# Patient Record
Sex: Male | Born: 1960
Health system: Southern US, Community
[De-identification: ages and names within clinical notes are randomized; demographics above are authoritative.]

## PROBLEM LIST (undated history)

## (undated) DIAGNOSIS — E785 Hyperlipidemia, unspecified: Secondary | ICD-10-CM

## (undated) DIAGNOSIS — T7840XA Allergy, unspecified, initial encounter: Secondary | ICD-10-CM

## (undated) HISTORY — DX: Allergy, unspecified, initial encounter: T78.40XA

## (undated) HISTORY — DX: Hyperlipidemia, unspecified: E78.5

---

## 1999-03-10 ENCOUNTER — Emergency Department (HOSPITAL_COMMUNITY): Admission: EM | Admit: 1999-03-10 | Discharge: 1999-03-10 | Payer: Self-pay | Admitting: Emergency Medicine

## 1999-03-10 ENCOUNTER — Encounter: Payer: Self-pay | Admitting: Emergency Medicine

## 2004-05-09 ENCOUNTER — Emergency Department (HOSPITAL_COMMUNITY): Admission: AC | Admit: 2004-05-09 | Discharge: 2004-05-09 | Payer: Self-pay
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
## 2019-04-10 DIAGNOSIS — F4323 Adjustment disorder with mixed anxiety and depressed mood: Secondary | ICD-10-CM | POA: Diagnosis not present

## 2019-04-17 DIAGNOSIS — F4323 Adjustment disorder with mixed anxiety and depressed mood: Secondary | ICD-10-CM | POA: Diagnosis not present

## 2019-05-30 DIAGNOSIS — H40033 Anatomical narrow angle, bilateral: Secondary | ICD-10-CM | POA: Diagnosis not present

## 2019-05-30 DIAGNOSIS — H2513 Age-related nuclear cataract, bilateral: Secondary | ICD-10-CM | POA: Diagnosis not present

## 2019-09-19 ENCOUNTER — Other Ambulatory Visit: Payer: Self-pay

## 2019-09-19 ENCOUNTER — Encounter: Payer: Self-pay | Admitting: Family Medicine

## 2019-09-19 ENCOUNTER — Ambulatory Visit: Payer: BC Managed Care – PPO | Admitting: Family Medicine

## 2019-09-19 VITALS — BP 139/83 | HR 65 | Temp 99.6°F | Resp 18 | Ht 70.5 in | Wt 177.8 lb

## 2019-09-19 DIAGNOSIS — Z1212 Encounter for screening for malignant neoplasm of rectum: Secondary | ICD-10-CM

## 2019-09-19 DIAGNOSIS — Z1211 Encounter for screening for malignant neoplasm of colon: Secondary | ICD-10-CM

## 2019-09-19 DIAGNOSIS — Z13220 Encounter for screening for lipoid disorders: Secondary | ICD-10-CM | POA: Diagnosis not present

## 2019-09-19 DIAGNOSIS — Z125 Encounter for screening for malignant neoplasm of prostate: Secondary | ICD-10-CM

## 2019-09-19 DIAGNOSIS — Z1329 Encounter for screening for other suspected endocrine disorder: Secondary | ICD-10-CM

## 2019-09-19 DIAGNOSIS — Z6825 Body mass index (BMI) 25.0-25.9, adult: Secondary | ICD-10-CM | POA: Diagnosis not present

## 2019-09-19 DIAGNOSIS — Z0000 Encounter for general adult medical examination without abnormal findings: Secondary | ICD-10-CM

## 2019-09-19 DIAGNOSIS — Z720 Tobacco use: Secondary | ICD-10-CM | POA: Insufficient documentation

## 2019-09-19 DIAGNOSIS — Z130 Encounter for screening for diseases of the blood and blood-forming organs and certain disorders involving the immune mechanism: Secondary | ICD-10-CM

## 2019-09-19 DIAGNOSIS — T7840XA Allergy, unspecified, initial encounter: Secondary | ICD-10-CM | POA: Insufficient documentation

## 2019-09-19 NOTE — Progress Notes (Signed)
Subjective:  Patient ID: Rodney Sims, male    DOB: 09-Feb-1961, 58 y.o.   MRN: [id number]  Patient Care Team: Baruch Gouty, FNP as PCP - General (Family Medicine)   Chief Complaint:  Establish Care   HPI: Rodney Sims is a 59 y.o. male presenting on 09/19/2019 for Establish Care   Pt presents today to establish care. States he is pretty healthy overall. No specific complaints or concerns today. States he does not see a PCP on a regular basis. States he does have chronic rhinitis and takes antihistamines on a regular basis that controls symptoms well. He states he does smoke 1 PPD and has been smoking for 27 years. States he drinks at least 4 cans of beer per night and sometimes more. He denies counseling for cessation today. He does not watch his diet or exercise on a regular basis.     Relevant past medical, surgical, family, and social history reviewed and updated as indicated.  Allergies and medications reviewed and updated. Date reviewed: Chart in Epic.   Past Medical History:  Diagnosis Date  . Allergy     History reviewed. No pertinent surgical history.  Social History   Socioeconomic History  . Marital status: Married    Spouse name: Not on file  . Number of children: 1  . Years of education: Not on file  . Highest education level: Not on file  Occupational History  . Occupation: maintanence  Tobacco Use  . Smoking status: Current Every Day Smoker    Packs/day: 1.50    Years: 27.00    Pack years: 40.50    Types: Cigarettes  . Smokeless tobacco: Never Used  Substance and Sexual Activity  . Alcohol use: Not on file  . Drug use: Never  . Sexual activity: Not on file  Other Topics Concern  . Not on file  Social History Narrative  . Not on file   Social Determinants of Health   Financial Resource Strain:   . Difficulty of Paying Living Expenses: Not on file  Food Insecurity:   . Worried About Charity fundraiser in the Last Year: Not on file  .  Ran Out of Food in the Last Year: Not on file  Transportation Needs:   . Lack of Transportation (Medical): Not on file  . Lack of Transportation (Non-Medical): Not on file  Physical Activity:   . Days of Exercise per Week: Not on file  . Minutes of Exercise per Session: Not on file  Stress:   . Feeling of Stress : Not on file  Social Connections:   . Frequency of Communication with Friends and Family: Not on file  . Frequency of Social Gatherings with Friends and Family: Not on file  . Attends Religious Services: Not on file  . Active Member of Clubs or Organizations: Not on file  . Attends Archivist Meetings: Not on file  . Marital Status: Not on file  Intimate Partner Violence:   . Fear of Current or Ex-Partner: Not on file  . Emotionally Abused: Not on file  . Physically Abused: Not on file  . Sexually Abused: Not on file    Outpatient Encounter Medications as of 09/19/2019  Medication Sig  . Fexofenadine HCl (ALLEGRA PO) Take by mouth.   No facility-administered encounter medications on file as of 09/19/2019.    Allergies  Allergen Reactions  . Penicillins     Caused seizures as child  . Bee  Venom Hives  . Betamethasone   . Codeine   . Cyclobenzaprine     Review of Systems  Constitutional: Negative for activity change, appetite change, chills, diaphoresis, fatigue, fever and unexpected weight change.  HENT: Positive for postnasal drip and rhinorrhea. Negative for congestion.   Eyes: Negative.  Negative for photophobia and visual disturbance.  Respiratory: Negative for cough, chest tightness and shortness of breath.   Cardiovascular: Negative for chest pain, palpitations and leg swelling.  Gastrointestinal: Negative for abdominal pain, blood in stool, constipation, diarrhea, nausea and vomiting.  Endocrine: Negative.  Negative for cold intolerance, heat intolerance, polydipsia, polyphagia and polyuria.  Genitourinary: Negative for decreased urine volume,  difficulty urinating, dysuria, frequency and urgency.  Musculoskeletal: Negative for arthralgias, back pain, gait problem, joint swelling, myalgias, neck pain and neck stiffness.  Skin: Negative.   Allergic/Immunologic: Negative.   Neurological: Negative for dizziness, tremors, seizures, syncope, facial asymmetry, speech difficulty, weakness, light-headedness, numbness and headaches.  Hematological: Negative.   Psychiatric/Behavioral: Negative for confusion, hallucinations, sleep disturbance and suicidal ideas.  All other systems reviewed and are negative.       Objective:  BP 139/83   Pulse 65   Temp 99.6 F (37.6 C)   Resp 18   Ht 5' 10.5" (1.791 m)   Wt 177 lb 12.8 oz (80.6 kg)   SpO2 97%   BMI 25.15 kg/m    Wt Readings from Last 3 Encounters:  09/19/19 177 lb 12.8 oz (80.6 kg)    Physical Exam Vitals and nursing note reviewed.  Constitutional:      General: He is not in acute distress.    Appearance: Normal appearance. He is well-developed, well-groomed and overweight. He is not ill-appearing, toxic-appearing or diaphoretic.  HENT:     Head: Normocephalic and atraumatic.     Jaw: There is normal jaw occlusion.     Right Ear: Hearing, tympanic membrane, ear canal and external ear normal.     Left Ear: Hearing, tympanic membrane, ear canal and external ear normal.     Nose: Nose normal.     Mouth/Throat:     Lips: Pink.     Mouth: Mucous membranes are moist.     Pharynx: Oropharynx is clear. Uvula midline.  Eyes:     General: Lids are normal.     Extraocular Movements: Extraocular movements intact.     Conjunctiva/sclera: Conjunctivae normal.     Pupils: Pupils are equal, round, and reactive to light.  Neck:     Thyroid: No thyroid mass, thyromegaly or thyroid tenderness.     Vascular: No carotid bruit or JVD.     Trachea: Trachea and phonation normal.  Cardiovascular:     Rate and Rhythm: Normal rate and regular rhythm.     Chest Wall: PMI is not displaced.      Pulses: Normal pulses.     Heart sounds: Normal heart sounds. No murmur. No friction rub. No gallop.   Pulmonary:     Effort: Pulmonary effort is normal. No respiratory distress.     Breath sounds: Normal breath sounds. No wheezing.  Abdominal:     General: Bowel sounds are normal. There is no distension or abdominal bruit.     Palpations: Abdomen is soft. There is no hepatomegaly or splenomegaly.     Tenderness: There is no abdominal tenderness. There is no right CVA tenderness or left CVA tenderness.     Hernia: No hernia is present.  Musculoskeletal:  General: Normal range of motion.     Cervical back: Normal range of motion and neck supple.     Right lower leg: No edema.     Left lower leg: No edema.  Lymphadenopathy:     Cervical: No cervical adenopathy.  Skin:    General: Skin is warm and dry.     Capillary Refill: Capillary refill takes less than 2 seconds.     Coloration: Skin is not cyanotic, jaundiced or pale.     Findings: No rash.  Neurological:     General: No focal deficit present.     Mental Status: He is alert and oriented to person, place, and time.     Cranial Nerves: Cranial nerves are intact. No cranial nerve deficit.     Sensory: Sensation is intact. No sensory deficit.     Motor: Motor function is intact. No weakness.     Coordination: Coordination is intact. Coordination normal.     Gait: Gait is intact. Gait normal.     Deep Tendon Reflexes: Reflexes are normal and symmetric. Reflexes normal.  Psychiatric:        Attention and Perception: Attention and perception normal.        Mood and Affect: Mood and affect normal.        Speech: Speech normal.        Behavior: Behavior normal. Behavior is cooperative.        Thought Content: Thought content normal.        Cognition and Memory: Cognition and memory normal.        Judgment: Judgment normal.     No results found for this or any previous visit.     Pertinent labs & imaging results that were  available during my care of the patient were reviewed by me and considered in my medical decision making.  Assessment & Plan:  Lambros was seen today for establish care.  Diagnoses and all orders for this visit:  Annual physical exam Health maintenance discussed. Diet and exercise encouraged. Labs pending. Cologuard ordered.  -     CBC with Differential/Platelet -     CMP14+EGFR -     Lipid panel -     Thyroid Panel With TSH -     HIV Antibody (routine testing w rflx) -     Hepatitis C antibody -     PSA, total and free -     Cologuard  BMI 25.0-25.9,adult Diet and exercise encouraged. Labs pending.  -     CBC with Differential/Platelet -     CMP14+EGFR -     Lipid panel -     Thyroid Panel With TSH  Screening for colorectal cancer -     Cologuard  Screening for prostate cancer -     PSA, total and free  Screening for lipid disorders -     Lipid panel  Screening for deficiency anemia -     CBC with Differential/Platelet  Screening for endocrine disorder -     CMP14+EGFR -     Thyroid Panel With TSH  Pt declines smoking cessation counseling today.    Continue all other maintenance medications.  Follow up plan: Return in about 1 year (around 09/18/2020), or if symptoms worsen or fail to improve.  Continue healthy lifestyle choices, including diet (rich in fruits, vegetables, and lean proteins, and low in salt and simple carbohydrates) and exercise (at least 30 minutes of moderate physical activity daily).  Educational handout given for health  maintenance  The above assessment and management plan was discussed with the patient. The patient verbalized understanding of and has agreed to the management plan. Patient is aware to call the clinic if they develop any new symptoms or if symptoms persist or worsen. Patient is aware when to return to the clinic for a follow-up visit. Patient educated on when it is appropriate to go to the emergency department.   Monia Pouch,  FNP-C [facility] [phone]

## 2019-09-19 NOTE — Patient Instructions (Signed)

## 2019-09-20 LAB — THYROID PANEL WITH TSH
Free Thyroxine Index: 1.8 (ref 1.2–4.9)
T3 Uptake Ratio: 35 % (ref 24–39)
T4, Total: 5.1 ug/dL (ref 4.5–12.0)
TSH: 1.44 u[IU]/mL (ref 0.450–4.500)

## 2019-09-20 LAB — CBC WITH DIFFERENTIAL/PLATELET
Basophils Absolute: 0.1 10*3/uL (ref 0.0–0.2)
Basos: 2 %
EOS (ABSOLUTE): 0.2 10*3/uL (ref 0.0–0.4)
Eos: 3 %
Hematocrit: 43.4 % (ref 37.5–51.0)
Hemoglobin: 14.8 g/dL (ref 13.0–17.7)
Immature Grans (Abs): 0 10*3/uL (ref 0.0–0.1)
Immature Granulocytes: 0 %
Lymphocytes Absolute: 2 10*3/uL (ref 0.7–3.1)
Lymphs: 30 %
MCH: 33.3 pg — ABNORMAL HIGH (ref 26.6–33.0)
MCHC: 34.1 g/dL (ref 31.5–35.7)
MCV: 98 fL — ABNORMAL HIGH (ref 79–97)
Monocytes Absolute: 0.7 10*3/uL (ref 0.1–0.9)
Monocytes: 11 %
Neutrophils Absolute: 3.6 10*3/uL (ref 1.4–7.0)
Neutrophils: 54 %
Platelets: 248 10*3/uL (ref 150–450)
RBC: 4.44 x10E6/uL (ref 4.14–5.80)
RDW: 12.4 % (ref 11.6–15.4)
WBC: 6.6 10*3/uL (ref 3.4–10.8)

## 2019-09-20 LAB — PSA, TOTAL AND FREE
PSA, Free Pct: 18.3 %
PSA, Free: 0.22 ng/mL
Prostate Specific Ag, Serum: 1.2 ng/mL (ref 0.0–4.0)

## 2019-09-20 LAB — CMP14+EGFR
ALT: 27 IU/L (ref 0–44)
AST: 30 IU/L (ref 0–40)
Albumin/Globulin Ratio: 2 (ref 1.2–2.2)
Albumin: 4.9 g/dL (ref 3.8–4.9)
Alkaline Phosphatase: 62 IU/L (ref 39–117)
BUN/Creatinine Ratio: 13 (ref 9–20)
BUN: 11 mg/dL (ref 6–24)
Bilirubin Total: 0.6 mg/dL (ref 0.0–1.2)
CO2: 23 mmol/L (ref 20–29)
Calcium: 9.9 mg/dL (ref 8.7–10.2)
Chloride: 102 mmol/L (ref 96–106)
Creatinine, Ser: 0.87 mg/dL (ref 0.76–1.27)
GFR calc Af Amer: 111 mL/min/{1.73_m2} (ref >59)
GFR calc non Af Amer: 96 mL/min/{1.73_m2} (ref >59)
Globulin, Total: 2.4 g/dL (ref 1.5–4.5)
Glucose: 85 mg/dL (ref 65–99)
Potassium: 4.7 mmol/L (ref 3.5–5.2)
Sodium: 142 mmol/L (ref 134–144)
Total Protein: 7.3 g/dL (ref 6.0–8.5)

## 2019-09-20 LAB — LIPID PANEL
Chol/HDL Ratio: 2 ratio (ref 0.0–5.0)
Cholesterol, Total: 210 mg/dL — ABNORMAL HIGH (ref 100–199)
HDL: 103 mg/dL (ref >39)
LDL Chol Calc (NIH): 97 mg/dL (ref 0–99)
Triglycerides: 58 mg/dL (ref 0–149)
VLDL Cholesterol Cal: 10 mg/dL (ref 5–40)

## 2019-09-20 LAB — HIV ANTIBODY (ROUTINE TESTING W REFLEX): HIV Screen 4th Generation wRfx: NONREACTIVE

## 2019-09-20 LAB — HEPATITIS C ANTIBODY: Hep C Virus Ab: <0.1 s/co ratio (ref 0.0–0.9)

## 2019-10-03 DIAGNOSIS — Z1212 Encounter for screening for malignant neoplasm of rectum: Secondary | ICD-10-CM | POA: Diagnosis not present

## 2019-10-14 LAB — COLOGUARD: Cologuard: NEGATIVE

## 2019-11-10 ENCOUNTER — Encounter: Payer: Self-pay | Admitting: Nurse Practitioner

## 2019-11-10 ENCOUNTER — Other Ambulatory Visit: Payer: Self-pay

## 2019-11-10 ENCOUNTER — Ambulatory Visit: Payer: BC Managed Care – PPO | Admitting: Nurse Practitioner

## 2019-11-10 VITALS — BP 153/84 | HR 65 | Temp 97.5°F | Resp 20 | Ht 70.0 in | Wt 186.0 lb

## 2019-11-10 DIAGNOSIS — I10 Essential (primary) hypertension: Secondary | ICD-10-CM | POA: Diagnosis not present

## 2019-11-10 DIAGNOSIS — M5441 Lumbago with sciatica, right side: Secondary | ICD-10-CM

## 2019-11-10 MED ORDER — CYCLOBENZAPRINE HCL 10 MG PO TABS
10.0000 mg | ORAL_TABLET | Freq: Three times a day (TID) | ORAL | 1 refills | Status: DC | PRN
Start: 2019-11-10 — End: 2020-09-17

## 2019-11-10 MED ORDER — PREDNISONE 20 MG PO TABS: ORAL_TABLET | ORAL | 0 refills | Status: DC

## 2019-11-10 NOTE — Patient Instructions (Signed)
Acute Back Pain, Adult Acute back pain is sudden and usually short-lived. It is often caused by an injury to the muscles and tissues in the back. The injury may result from:  A muscle or ligament getting overstretched or torn (strained). Ligaments are tissues that connect bones to each other. Lifting something improperly can cause a back strain.  Wear and tear (degeneration) of the spinal disks. Spinal disks are circular tissue that provides cushioning between the bones of the spine (vertebrae).  Twisting motions, such as while playing sports or doing yard work.  A hit to the back.  Arthritis. You may have a physical exam, lab tests, and imaging tests to find the cause of your pain. Acute back pain usually goes away with rest and home care. Follow these instructions at home: Managing pain, stiffness, and swelling  Take over-the-counter and prescription medicines only as told by your health care provider.  Your health care provider may recommend applying ice during the first 24-48 hours after your pain starts. To do this: ? Put ice in a plastic bag. ? Place a towel between your skin and the bag. ? Leave the ice on for 20 minutes, 2-3 times a day.  If directed, apply heat to the affected area as often as told by your health care provider. Use the heat source that your health care provider recommends, such as a moist heat pack or a heating pad. ? Place a towel between your skin and the heat source. ? Leave the heat on for 20-30 minutes. ? Remove the heat if your skin turns bright red. This is especially important if you are unable to feel pain, heat, or cold. You have a greater risk of getting burned. Activity   Do not stay in bed. Staying in bed for more than 1-2 days can delay your recovery.  Sit up and stand up straight. Avoid leaning forward when you sit, or hunching over when you stand. ? If you work at a desk, sit close to it so you do not need to lean over. Keep your chin tucked  in. Keep your neck drawn back, and keep your elbows bent at a right angle. Your arms should look like the letter "L." ? Sit high and close to the steering wheel when you drive. Add lower back (lumbar) support to your car seat, if needed.  Take short walks on even surfaces as soon as you are able. Try to increase the length of time you walk each day.  Do not sit, drive, or stand in one place for more than 30 minutes at a time. Sitting or standing for long periods of time can put stress on your back.  Do not drive or use heavy machinery while taking prescription pain medicine.  Use proper lifting techniques. When you bend and lift, use positions that put less stress on your back: ? Bend your knees. ? Keep the load close to your body. ? Avoid twisting.  Exercise regularly as told by your health care provider. Exercising helps your back heal faster and helps prevent back injuries by keeping muscles strong and flexible.  Work with a physical therapist to make a safe exercise program, as recommended by your health care provider. Do any exercises as told by your physical therapist. Lifestyle  Maintain a healthy weight. Extra weight puts stress on your back and makes it difficult to have good posture.  Avoid activities or situations that make you feel anxious or stressed. Stress and anxiety increase muscle   tension and can make back pain worse. Learn ways to manage anxiety and stress, such as through exercise. General instructions  Sleep on a firm mattress in a comfortable position. Try lying on your side with your knees slightly bent. If you lie on your back, put a pillow under your knees.  Follow your treatment plan as told by your health care provider. This may include: ? Cognitive or behavioral therapy. ? Acupuncture or massage therapy. ? Meditation or yoga. Contact a health care provider if:  You have pain that is not relieved with rest or medicine.  You have increasing pain going down  into your legs or buttocks.  Your pain does not improve after 2 weeks.  You have pain at night.  You lose weight without trying.  You have a fever or chills. Get help right away if:  You develop new bowel or bladder control problems.  You have unusual weakness or numbness in your arms or legs.  You develop nausea or vomiting.  You develop abdominal pain.  You feel faint. Summary  Acute back pain is sudden and usually short-lived.  Use proper lifting techniques. When you bend and lift, use positions that put less stress on your back.  Take over-the-counter and prescription medicines and apply heat or ice as directed by your health care provider. This information is not intended to replace advice given to you by your health care provider. Make sure you discuss any questions you have with your health care provider. Document Revised: 11/12/2018 Document Reviewed: 03/07/2017 Elsevier Patient Education  2020 [employer]  

## 2019-11-10 NOTE — Progress Notes (Signed)
Subjective:    Patient ID: Rodney Sims, male    DOB: July 20, 1961, 59 y.o.   MRN: [id number]   Chief Complaint: Back Pain   HPI Patient comes in today of low back pain. Started 2 weeks ago. He said he was standing still and felt a pull in his back. Went away and then next morning was having pain radiating into right butt check . Radiated down leg the first several days but now only hurts in buttocks. Rates pain 3/10 currently. Laying down increases pain. Sitting decreases pain. Back brac has helped as well.  * had steroid shot over 20 years ago that caused numbness for 2 days.  Review of Systems  Constitutional: Negative for diaphoresis.  Eyes: Negative for pain.  Respiratory: Negative for shortness of breath.   Cardiovascular: Negative for chest pain, palpitations and leg swelling.  Gastrointestinal: Negative for abdominal pain.  Endocrine: Negative for polydipsia.  Skin: Negative for rash.  Neurological: Negative for dizziness, weakness and headaches.  Hematological: Does not bruise/bleed easily.  All other systems reviewed and are negative.      Objective:   Physical Exam Vitals and nursing note reviewed.  Constitutional:      Appearance: Normal appearance. He is well-developed.  HENT:     Head: Normocephalic.     Nose: Nose normal.  Eyes:     Pupils: Pupils are equal, round, and reactive to light.  Neck:     Thyroid: No thyroid mass or thyromegaly.     Vascular: No carotid bruit or JVD.     Trachea: Phonation normal.  Cardiovascular:     Rate and Rhythm: Normal rate and regular rhythm.  Pulmonary:     Effort: Pulmonary effort is normal. No respiratory distress.     Breath sounds: Normal breath sounds.  Abdominal:     General: Bowel sounds are normal.     Palpations: Abdomen is soft.     Tenderness: There is no abdominal tenderness.  Musculoskeletal:        General: Normal range of motion.     Cervical back: Normal range of motion and neck supple.   Comments: Rises slowly from sitting to standing FROM of lumbar spine with back brace on causes no pain (-) SLR Motor, strength and sensation distally intact   Lymphadenopathy:     Cervical: No cervical adenopathy.  Skin:    General: Skin is warm and dry.  Neurological:     Mental Status: He is alert and oriented to person, place, and time.  Psychiatric:        Behavior: Behavior normal.        Thought Content: Thought content normal.        Judgment: Judgment normal.    BP (!) 153/84   Pulse 65   Temp (!) 97.5 F (36.4 C) (Temporal)   Resp 20   Ht 5\' 10"  (1.778 m)   Wt 186 lb (84.4 kg)   SpO2 97%   BMI 26.69 kg/m        Assessment & Plan:  Rodney Sims in today with chief complaint of Back Pain   1. Acute right-sided low back pain with right-sided sciatica Moist heat Rest RTO prn  Meds ordered this encounter  Medications  . predniSONE (DELTASONE) 20 MG tablet    Sig: 2 po at sametime daily for 5 days    Dispense:  10 tablet    Refill:  0    Order Specific Question:   Supervising  Provider    Answer:   Arville Care A [[id number]]  . cyclobenzaprine (FLEXERIL) 10 MG tablet    Sig: Take 1 tablet (10 mg total) by mouth 3 (three) times daily as needed for muscle spasms.    Dispense:  30 tablet    Refill:  1    Order Specific Question:   Supervising Provider    Answer:   Arville Care A [[id number]]     2. Essential hypertension Take blood pressure at home and keep diary of blood pressures    The above assessment and management plan was discussed with the patient. The patient verbalized understanding of and has agreed to the management plan. Patient is aware to call the clinic if symptoms persist or worsen. Patient is aware when to return to the clinic for a follow-up visit. Patient educated on when it is appropriate to go to the emergency department.   Mary-Margaret Daphine Deutscher, FNP

## 2020-08-13 ENCOUNTER — Ambulatory Visit: Payer: BC Managed Care – PPO | Admitting: Family Medicine

## 2020-08-20 ENCOUNTER — Other Ambulatory Visit: Payer: Self-pay

## 2020-08-20 ENCOUNTER — Ambulatory Visit: Payer: BC Managed Care – PPO | Admitting: Family Medicine

## 2020-08-20 ENCOUNTER — Encounter: Payer: Self-pay | Admitting: Family Medicine

## 2020-08-20 VITALS — BP 127/67 | HR 68 | Temp 98.8°F | Ht 70.0 in | Wt 189.2 lb

## 2020-08-20 DIAGNOSIS — H6123 Impacted cerumen, bilateral: Secondary | ICD-10-CM | POA: Diagnosis not present

## 2020-08-20 NOTE — Patient Instructions (Signed)

## 2020-08-20 NOTE — Progress Notes (Signed)
Subjective: CC: ear congstion PCP: Rodney Perking, FNP  HFG:BMSXJ D Ditullio is a 60 y.o. male presenting to clinic today for:  1. Ear congestion Rodney Sims reports ear fullness in both ears. This has been going on for years. He reports that he feels like "fluid runs all down in right ear" on the inside. This feeling resolves about 30 minutes after taking allegra in the morning. He denies fever, erythema, or tenderness. Denies URI symptoms.   Relevant past medical, surgical, family, and social history reviewed and updated as indicated.  Allergies and medications reviewed and updated.  Allergies  Allergen Reactions  . Penicillins     Caused seizures as child  . Bee Venom Hives  . Betamethasone   . Codeine    Past Medical History:  Diagnosis Date  . Allergy     Current Outpatient Medications:  .  cyclobenzaprine (FLEXERIL) 10 MG tablet, Take 1 tablet (10 mg total) by mouth 3 (three) times daily as needed for muscle spasms., Disp: 30 tablet, Rfl: 1 .  Fexofenadine HCl (ALLEGRA PO), Take by mouth., Disp: , Rfl:  Social History   Socioeconomic History  . Marital status: Married    Spouse name: Not on file  . Number of children: 1  . Years of education: Not on file  . Highest education level: Not on file  Occupational History  . Occupation: maintanence  Tobacco Use  . Smoking status: Current Every Day Smoker    Packs/day: 1.50    Years: 27.00    Pack years: 40.50    Types: Cigarettes  . Smokeless tobacco: Never Used  Vaping Use  . Vaping Use: Former  Substance and Sexual Activity  . Alcohol use: Not on file  . Drug use: Never  . Sexual activity: Not on file  Other Topics Concern  . Not on file  Social History Narrative  . Not on file   Social Determinants of Health   Financial Resource Strain: Not on file  Food Insecurity: Not on file  Transportation Needs: Not on file  Physical Activity: Not on file  Stress: Not on file  Social Connections: Not on file   Intimate Partner Violence: Not on file   Family History  Problem Relation Age of Onset  . Hyperlipidemia Mother   . Heart disease Father   . Parkinson's disease Father     Review of Systems  As per HPI.  Objective: Office vital signs reviewed. BP 127/67   Pulse 68   Temp 98.8 F (37.1 C) (Temporal)   Ht 5' 10"  (1.778 m)   Wt 189 lb 4 oz (85.8 kg)   BMI 27.15 kg/m    Physical Examination:  Physical Exam Vitals and nursing note reviewed.  Constitutional:      General: He is not in acute distress.    Appearance: Normal appearance. He is not ill-appearing or toxic-appearing.  HENT:     Right Ear: Ear canal and external ear normal. No drainage, swelling or tenderness. There is impacted cerumen.     Left Ear: Ear canal and external ear normal. No drainage, swelling or tenderness. There is impacted cerumen.  Eyes:     Extraocular Movements: Extraocular movements intact.     Conjunctiva/sclera: Conjunctivae normal.  Skin:    General: Skin is warm and dry.  Neurological:     Mental Status: He is alert and oriented to person, place, and time.  Psychiatric:        Mood and Affect: Mood normal.  Behavior: Behavior normal.    Ear Cerumen Removal  Date/Time: 08/20/2020 8:30 AM Performed by: Rodney Perking, FNP Authorized by: Rodney Perking, FNP   Anesthesia: Local Anesthetic: none Location details: right ear and left ear Patient tolerance: patient tolerated the procedure well with no immediate complications Comments: Normal TMs visualized after irrigation.  Procedure type: irrigation  Sedation: Patient sedated: no      Results for orders placed or performed in visit on 09/19/19  CBC with Differential/Platelet  Result Value Ref Range   WBC 6.6 3.4 - 10.8 x10E3/uL   RBC 4.44 4.14 - 5.80 x10E6/uL   Hemoglobin 14.8 13.0 - 17.7 g/dL   Hematocrit 43.4 37.5 - 51.0 %   MCV 98 (H) 79 - 97 fL   MCH 33.3 (H) 26.6 - 33.0 pg   MCHC 34.1 31.5 - 35.7 g/dL    RDW 12.4 11.6 - 15.4 %   Platelets 248 150 - 450 x10E3/uL   Neutrophils 54 Not Estab. %   Lymphs 30 Not Estab. %   Monocytes 11 Not Estab. %   Eos 3 Not Estab. %   Basos 2 Not Estab. %   Neutrophils Absolute 3.6 1.4 - 7.0 x10E3/uL   Lymphocytes Absolute 2.0 0.7 - 3.1 x10E3/uL   Monocytes Absolute 0.7 0.1 - 0.9 x10E3/uL   EOS (ABSOLUTE) 0.2 0.0 - 0.4 x10E3/uL   Basophils Absolute 0.1 0.0 - 0.2 x10E3/uL   Immature Granulocytes 0 Not Estab. %   Immature Grans (Abs) 0.0 0.0 - 0.1 x10E3/uL  CMP14+EGFR  Result Value Ref Range   Glucose 85 65 - 99 mg/dL   BUN 11 6 - 24 mg/dL   Creatinine, Ser 0.87 0.76 - 1.27 mg/dL   GFR calc non Af Amer 96 >59 mL/min/1.73   GFR calc Af Amer 111 >59 mL/min/1.73   BUN/Creatinine Ratio 13 9 - 20   Sodium 142 134 - 144 mmol/L   Potassium 4.7 3.5 - 5.2 mmol/L   Chloride 102 96 - 106 mmol/L   CO2 23 20 - 29 mmol/L   Calcium 9.9 8.7 - 10.2 mg/dL   Total Protein 7.3 6.0 - 8.5 g/dL   Albumin 4.9 3.8 - 4.9 g/dL   Globulin, Total 2.4 1.5 - 4.5 g/dL   Albumin/Globulin Ratio 2.0 1.2 - 2.2   Bilirubin Total 0.6 0.0 - 1.2 mg/dL   Alkaline Phosphatase 62 39 - 117 IU/L   AST 30 0 - 40 IU/L   ALT 27 0 - 44 IU/L  Lipid panel  Result Value Ref Range   Cholesterol, Total 210 (H) 100 - 199 mg/dL   Triglycerides 58 0 - 149 mg/dL   HDL 103 >39 mg/dL   VLDL Cholesterol Cal 10 5 - 40 mg/dL   LDL Chol Calc (NIH) 97 0 - 99 mg/dL   Chol/HDL Ratio 2.0 0.0 - 5.0 ratio  Thyroid Panel With TSH  Result Value Ref Range   TSH 1.440 0.450 - 4.500 uIU/mL   T4, Total 5.1 4.5 - 12.0 ug/dL   T3 Uptake Ratio 35 24 - 39 %   Free Thyroxine Index 1.8 1.2 - 4.9  HIV Antibody (routine testing w rflx)  Result Value Ref Range   HIV Screen 4th Generation wRfx Non Reactive Non Reactive  Hepatitis C antibody  Result Value Ref Range   Hep C Virus Ab <0.1 0.0 - 0.9 s/co ratio  PSA, total and free  Result Value Ref Range   Prostate Specific Ag, Serum 1.2 0.0 -  4.0 ng/mL   PSA, Free  0.22 N/A ng/mL   PSA, Free Pct 18.3 %  Cologuard  Result Value Ref Range   Cologuard Negative Negative     Assessment/ Plan: Latroy was seen today for ear fullness.  Diagnoses and all orders for this visit:  Bilateral impacted cerumen Irrigation today in office. Add flonase OTC for ear fullness. Debrox OTC as needed for ear wax buildup. Handout given. Return to office for new or worsening symptoms, or if symptoms persist.  -     Ear Lavage   Return in about 1 month (around 09/20/2020) for CPE.  The above assessment and management plan was discussed with the patient. The patient verbalized understanding of and has agreed to the management plan. Patient is aware to call the clinic if symptoms persist or worsen. Patient is aware when to return to the clinic for a follow-up visit. Patient educated on when it is appropriate to go to the emergency department.   Marjorie Smolder, FNP-C [facility] [address] [place], [place] [address] [phone]

## 2020-09-17 ENCOUNTER — Ambulatory Visit (INDEPENDENT_AMBULATORY_CARE_PROVIDER_SITE_OTHER): Payer: BC Managed Care – PPO | Admitting: Family Medicine

## 2020-09-17 ENCOUNTER — Other Ambulatory Visit: Payer: Self-pay

## 2020-09-17 ENCOUNTER — Encounter: Payer: Self-pay | Admitting: Family Medicine

## 2020-09-17 VITALS — BP 123/66 | HR 66 | Temp 98.3°F | Ht 70.0 in | Wt 186.4 lb

## 2020-09-17 DIAGNOSIS — Z125 Encounter for screening for malignant neoplasm of prostate: Secondary | ICD-10-CM | POA: Diagnosis not present

## 2020-09-17 DIAGNOSIS — Z720 Tobacco use: Secondary | ICD-10-CM

## 2020-09-17 DIAGNOSIS — Z0001 Encounter for general adult medical examination with abnormal findings: Secondary | ICD-10-CM | POA: Diagnosis not present

## 2020-09-17 DIAGNOSIS — Z0000 Encounter for general adult medical examination without abnormal findings: Secondary | ICD-10-CM | POA: Diagnosis not present

## 2020-09-17 DIAGNOSIS — Z1211 Encounter for screening for malignant neoplasm of colon: Secondary | ICD-10-CM | POA: Diagnosis not present

## 2020-09-17 DIAGNOSIS — Z6826 Body mass index (BMI) 26.0-26.9, adult: Secondary | ICD-10-CM

## 2020-09-17 DIAGNOSIS — N529 Male erectile dysfunction, unspecified: Secondary | ICD-10-CM

## 2020-09-17 LAB — COMPREHENSIVE METABOLIC PANEL

## 2020-09-17 LAB — CBC WITH DIFFERENTIAL/PLATELET
Eos: 4 %
Hemoglobin: 14.6 g/dL (ref 13.0–17.7)
Immature Grans (Abs): 0 10*3/uL (ref 0.0–0.1)
MCV: 97 fL (ref 79–97)
Neutrophils Absolute: 2.9 10*3/uL (ref 1.4–7.0)

## 2020-09-17 LAB — LIPID PANEL

## 2020-09-17 MED ORDER — SILDENAFIL CITRATE 100 MG PO TABS: 50.0000 mg | ORAL_TABLET | Freq: Every day | ORAL | 11 refills | Status: DC | PRN

## 2020-09-17 NOTE — Patient Instructions (Signed)
 Health Maintenance, Male Adopting a healthy lifestyle and getting preventive care are important in promoting health and wellness. Ask your health care provider about:  The right schedule for you to have regular tests and exams.  Things you can do on your own to prevent diseases and keep yourself healthy. What should I know about diet, weight, and exercise? Eat a healthy diet  Eat a diet that includes plenty of vegetables, fruits, low-fat dairy products, and lean protein.  Do not eat a lot of foods that are high in solid fats, added sugars, or sodium.   Maintain a healthy weight Body mass index (BMI) is a measurement that can be used to identify possible weight problems. It estimates body fat based on height and weight. Your health care provider can help determine your BMI and help you achieve or maintain a healthy weight. Get regular exercise Get regular exercise. This is one of the most important things you can do for your health. Most adults should:  Exercise for at least 150 minutes each week. The exercise should increase your heart rate and make you sweat (moderate-intensity exercise).  Do strengthening exercises at least twice a week. This is in addition to the moderate-intensity exercise.  Spend less time sitting. Even light physical activity can be beneficial. Watch cholesterol and blood lipids Have your blood tested for lipids and cholesterol at 58 years of age, then have this test every 5 years. You may need to have your cholesterol levels checked more often if:  Your lipid or cholesterol levels are high.  You are older than 58 years of age.  You are at high risk for heart disease. What should I know about cancer screening? Many types of cancers can be detected early and may often be prevented. Depending on your health history and family history, you may need to have cancer screening at various ages. This may include screening for:  Colorectal cancer.  Prostate  cancer.  Skin cancer.  Lung cancer. What should I know about heart disease, diabetes, and high blood pressure? Blood pressure and heart disease  High blood pressure causes heart disease and increases the risk of stroke. This is more likely to develop in people who have high blood pressure readings, are of African descent, or are overweight.  Talk with your health care provider about your target blood pressure readings.  Have your blood pressure checked: ? Every 3-5 years if you are 18-39 years of age. ? Every year if you are 40 years old or older.  If you are between the ages of 65 and 75 and are a current or former smoker, ask your health care provider if you should have a one-time screening for abdominal aortic aneurysm (AAA). Diabetes Have regular diabetes screenings. This checks your fasting blood sugar level. Have the screening done:  Once every three years after age 45 if you are at a normal weight and have a low risk for diabetes.  More often and at a younger age if you are overweight or have a high risk for diabetes. What should I know about preventing infection? Hepatitis B If you have a higher risk for hepatitis B, you should be screened for this virus. Talk with your health care provider to find out if you are at risk for hepatitis B infection. Hepatitis C Blood testing is recommended for:  Everyone born from 1945 through 1965.  Anyone with known risk factors for hepatitis C. Sexually transmitted infections (STIs)  You should be screened   each year for STIs, including gonorrhea and chlamydia, if: ? You are sexually active and are younger than 58 years of age. ? You are older than 58 years of age and your health care provider tells you that you are at risk for this type of infection. ? Your sexual activity has changed since you were last screened, and you are at increased risk for chlamydia or gonorrhea. Ask your health care provider if you are at risk.  Ask your  health care provider about whether you are at high risk for HIV. Your health care provider may recommend a prescription medicine to help prevent HIV infection. If you choose to take medicine to prevent HIV, you should first get tested for HIV. You should then be tested every 3 months for as long as you are taking the medicine. Follow these instructions at home: Lifestyle  Do not use any products that contain nicotine or tobacco, such as cigarettes, e-cigarettes, and chewing tobacco. If you need help quitting, ask your health care provider.  Do not use street drugs.  Do not share needles.  Ask your health care provider for help if you need support or information about quitting drugs. Alcohol use  Do not drink alcohol if your health care provider tells you not to drink.  If you drink alcohol: ? Limit how much you have to 0-2 drinks a day. ? Be aware of how much alcohol is in your drink. In the [place] one drink equals one 12 oz bottle of beer (355 mL), one 5 oz glass of wine (148 mL), or one 1 oz glass of hard liquor (44 mL). General instructions  Schedule regular health, dental, and eye exams.  Stay current with your vaccines.  Tell your health care provider if: ? You often feel depressed. ? You have ever been abused or do not feel safe at home. Summary  Adopting a healthy lifestyle and getting preventive care are important in promoting health and wellness.  Follow your health care provider's instructions about healthy diet, exercising, and getting tested or screened for diseases.  Follow your health care provider's instructions on monitoring your cholesterol and blood pressure. This information is not intended to replace advice given to you by your health care provider. Make sure you discuss any questions you have with your health care provider. Document Revised: 07/17/2018 Document Reviewed: 07/17/2018 Elsevier Patient Education  2021 Elsevier Inc.     Why follow it? Research  shows. . Those who follow the Mediterranean diet have a reduced risk of heart disease  . The diet is associated with a reduced incidence of Parkinson's and Alzheimer's diseases . People following the diet may have longer life expectancies and lower rates of chronic diseases  . The Dietary Guidelines for Americans recommends the Mediterranean diet as an eating plan to promote health and prevent disease  What Is the Mediterranean Diet?  . Healthy eating plan based on typical foods and recipes of Mediterranean-style cooking . The diet is primarily a plant based diet; these foods should make up a majority of meals   Starches - Plant based foods should make up a majority of meals - They are an important sources of vitamins, minerals, energy, antioxidants, and fiber - Choose whole grains, foods high in fiber and minimally processed items  - Typical grain sources include wheat, oats, barley, corn, brown rice, bulgar, farro, millet, polenta, couscous  - Various types of beans include chickpeas, lentils, fava beans, black beans, white beans   Fruits    Veggies - Large quantities of antioxidant rich fruits & veggies; 6 or more servings  - Vegetables can be eaten raw or lightly drizzled with oil and cooked  - Vegetables common to the traditional Mediterranean Diet include: artichokes, arugula, beets, broccoli, brussel sprouts, cabbage, carrots, celery, collard greens, cucumbers, eggplant, kale, leeks, lemons, lettuce, mushrooms, okra, onions, peas, peppers, potatoes, pumpkin, radishes, rutabaga, shallots, spinach, sweet potatoes, turnips, zucchini - Fruits common to the Mediterranean Diet include: apples, apricots, avocados, cherries, clementines, dates, figs, grapefruits, grapes, melons, nectarines, oranges, peaches, pears, pomegranates, strawberries, tangerines  Fats - Replace butter and margarine with healthy oils, such as olive oil, canola oil, and tahini  - Limit nuts to no more than a handful a day  -  Nuts include walnuts, almonds, pecans, pistachios, pine nuts  - Limit or avoid candied, honey roasted or heavily salted nuts - Olives are central to the Mediterranean diet - can be eaten whole or used in a variety of dishes   Meats Protein - Limiting red meat: no more than a few times a month - When eating red meat: choose lean cuts and keep the portion to the size of deck of cards - Eggs: approx. 0 to 4 times a week  - Fish and lean poultry: at least 2 a week  - Healthy protein sources include, chicken, turkey, lean beef, lamb - Increase intake of seafood such as tuna, salmon, trout, mackerel, shrimp, scallops - Avoid or limit high fat processed meats such as sausage and bacon  Dairy - Include moderate amounts of low fat dairy products  - Focus on healthy dairy such as fat free yogurt, skim milk, low or reduced fat cheese - Limit dairy products higher in fat such as whole or 2% milk, cheese, ice cream  Alcohol - Moderate amounts of red wine is ok  - No more than 5 oz daily for women (all ages) and men older than age 65  - No more than 10 oz of wine daily for men younger than 65  Other - Limit sweets and other desserts  - Use herbs and spices instead of salt to flavor foods  - Herbs and spices common to the traditional Mediterranean Diet include: basil, bay leaves, chives, cloves, cumin, fennel, garlic, lavender, marjoram, mint, oregano, parsley, pepper, rosemary, sage, savory, sumac, tarragon, thyme   It's not just a diet, it's a lifestyle:  . The Mediterranean diet includes lifestyle factors typical of those in the region  . Foods, drinks and meals are best eaten with others and savored . Daily physical activity is important for overall good health . This could be strenuous exercise like running and aerobics . This could also be more leisurely activities such as walking, housework, yard-work, or taking the stairs . Moderation is the key; a balanced and healthy diet accommodates most foods  and drinks . Consider portion sizes and frequency of consumption of certain foods   Meal Ideas & Options:  . Breakfast:  o Whole wheat toast or whole wheat English muffins with peanut butter & hard boiled egg o Steel cut oats topped with apples & cinnamon and skim milk  o Fresh fruit: banana, strawberries, melon, berries, peaches  o Smoothies: strawberries, bananas, greek yogurt, peanut butter o Low fat greek yogurt with blueberries and granola  o Egg white omelet with spinach and mushrooms o Breakfast couscous: whole wheat couscous, apricots, skim milk, cranberries  . Sandwiches:  o Hummus and grilled vegetables (peppers, zucchini, squash) on whole wheat   bread   o Grilled chicken on whole wheat pita with lettuce, tomatoes, cucumbers or tzatziki  o Tuna salad on whole wheat bread: tuna salad made with greek yogurt, olives, red peppers, capers, green onions o Garlic rosemary lamb pita: lamb sauted with garlic, rosemary, salt & pepper; add lettuce, cucumber, greek yogurt to pita - flavor with lemon juice and black pepper  . Seafood:  o Mediterranean grilled salmon, seasoned with garlic, basil, parsley, lemon juice and black pepper o Shrimp, lemon, and spinach whole-grain pasta salad made with low fat greek yogurt  o Seared scallops with lemon orzo  o Seared tuna steaks seasoned salt, pepper, coriander topped with tomato mixture of olives, tomatoes, olive oil, minced garlic, parsley, green onions and cappers  . Meats:  o Herbed greek chicken salad with kalamata olives, cucumber, feta  o Red bell peppers stuffed with spinach, bulgur, lean ground beef (or lentils) & topped with feta   o Kebabs: skewers of chicken, tomatoes, onions, zucchini, squash  o Turkey burgers: made with red onions, mint, dill, lemon juice, feta cheese topped with roasted red peppers . Vegetarian o Cucumber salad: cucumbers, artichoke hearts, celery, red onion, feta cheese, tossed in olive oil & lemon juice  o Hummus  and whole grain pita points with a greek salad (lettuce, tomato, feta, olives, cucumbers, red onion) o Lentil soup with celery, carrots made with vegetable broth, garlic, salt and pepper  o Tabouli salad: parsley, bulgur, mint, scallions, cucumbers, tomato, radishes, lemon juice, olive oil, salt and pepper.      American Heart Association (AHA) Exercise Recommendation  Being physically active is important to prevent heart disease and stroke, the nation's No. 1and No. 5killers. To improve overall cardiovascular health, we suggest at least 150 minutes per week of moderate exercise or 75 minutes per week of vigorous exercise (or a combination of moderate and vigorous activity). Thirty minutes a day, five times a week is an easy goal to remember. You will also experience benefits even if you divide your time into two or three segments of 10 to 15 minutes per day.  For people who would benefit from lowering their blood pressure or cholesterol, we recommend 40 minutes of aerobic exercise of moderate to vigorous intensity three to four times a week to lower the risk for heart attack and stroke.  Physical activity is anything that makes you move your body and burn calories.  This includes things like climbing stairs or playing sports. Aerobic exercises benefit your heart, and include walking, jogging, swimming or biking. Strength and stretching exercises are best for overall stamina and flexibility.  The simplest, positive change you can make to effectively improve your heart health is to start walking. It's enjoyable, free, easy, social and great exercise. A walking program is flexible and boasts high success rates because people can stick with it. It's easy for walking to become a regular and satisfying part of life.   For Overall Cardiovascular Health:  At least 30 minutes of moderate-intensity aerobic activity at least 5 days per week for a total of 150  OR   At least 25 minutes of vigorous  aerobic activity at least 3 days per week for a total of 75 minutes; or a combination of moderate- and vigorous-intensity aerobic activity  AND   Moderate- to high-intensity muscle-strengthening activity at least 2 days per week for additional health benefits.  For Lowering Blood Pressure and Cholesterol  An average 40 minutes of moderate- to vigorous-intensity aerobic activity 3   or 4 times per week  What if I can't make it to the time goal? Something is always better than nothing! And everyone has to start somewhere. Even if you've been sedentary for years, today is the day you can begin to make healthy changes in your life. If you don't think you'll make it for 30 or 40 minutes, set a reachable goal for today. You can work up toward your overall goal by increasing your time as you get stronger. Don't let all-or-nothing thinking rob you of doing what you can every day.  Source:http://www.heart.org    

## 2020-09-17 NOTE — Progress Notes (Signed)
Rodney Sims is a 60 y.o. male presents to office today for annual physical exam examination. He reports doing well overall. He does have one concern today.   Concerns today include: 1. ED Resnik reports issues with ED for the last few months. He denies changes in health or medications. He can achieve an erection, but other times he has difficulty achieving and maintaining an erection. He has never tried treatment before.   Occupation: Chartered certified accountant, Marital status: married, Substance use: smoker, not ready to quit Diet: eats pretty good, Exercise: active at work Last eye exam: once a year Last dental exam: hasn't been in a few years Last colonoscopy: has done cologuard in the past, it was negative  Immunizations needed: Flu Vaccine: declines  Tdap Vaccine: UTD    Past Medical History:  Diagnosis Date  . Allergy    Social History   Socioeconomic History  . Marital status: Married    Spouse name: Not on file  . Number of children: 1  . Years of education: Not on file  . Highest education level: Not on file  Occupational History  . Occupation: maintanence  Tobacco Use  . Smoking status: Current Every Day Smoker    Packs/day: 1.50    Years: 27.00    Pack years: 40.50    Types: Cigarettes  . Smokeless tobacco: Never Used  Vaping Use  . Vaping Use: Former  Substance and Sexual Activity  . Alcohol use: Not on file  . Drug use: Never  . Sexual activity: Not on file  Other Topics Concern  . Not on file  Social History Narrative  . Not on file   Social Determinants of Health   Financial Resource Strain: Not on file  Food Insecurity: Not on file  Transportation Needs: Not on file  Physical Activity: Not on file  Stress: Not on file  Social Connections: Not on file  Intimate Partner Violence: Not on file   History reviewed. No pertinent surgical history. Family History  Problem Relation Age of Onset  . Hyperlipidemia Mother   . Heart disease Father   .  Parkinson's disease Father     Current Outpatient Medications:  .  Fexofenadine HCl (ALLEGRA PO), Take by mouth., Disp: , Rfl:  .  fluticasone (FLONASE) 50 MCG/ACT nasal spray, Place into both nostrils daily., Disp: , Rfl:   Allergies  Allergen Reactions  . Penicillins     Caused seizures as child  . Bee Venom Hives  . Betamethasone   . Codeine      ROS: Review of Systems Pertinent items noted in HPI and remainder of comprehensive ROS otherwise negative.    Physical exam BP 123/66 (BP Location: Left Arm, Cuff Size: Large)   Pulse 66   Temp 98.3 F (36.8 C) (Temporal)   Ht 5\' 10"  (1.778 m)   Wt 186 lb 6.4 oz (84.6 kg)   SpO2 96%   BMI 26.75 kg/m  General appearance: alert and no distress Head: Normocephalic, without obvious abnormality, atraumatic Eyes: conjunctivae/corneas clear. PERRL, EOM's intact. Fundi benign. Ears: normal TM's and external ear canals both ears Nose: Nares normal. Septum midline. Mucosa normal. No drainage or sinus tenderness. Throat: lips, mucosa, and tongue normal; teeth and gums normal Neck: no adenopathy, no carotid bruit, no JVD, supple, symmetrical, trachea midline and thyroid not enlarged, symmetric, no tenderness/mass/nodules Lungs: clear to auscultation bilaterally Chest wall: no tenderness Heart: regular rate and rhythm, S1, S2 normal, no murmur, click, rub or gallop Abdomen:  soft, non-tender; bowel sounds normal; no masses,  no organomegaly Extremities: extremities normal, atraumatic, no cyanosis or edema Pulses: 2+ and symmetric Skin: Skin color, texture, turgor normal. No rashes or lesions Lymph nodes: Cervical, supraclavicular, and axillary nodes normal. Neurologic: Alert and oriented X 3, normal strength and tone. Normal symmetric reflexes. Normal coordination and gait    Assessment/ Plan: [name] was seen today for annual exam.  Diagnoses and all orders for this visit:  Routine general medical examination at a health care  facility Labs pending as below.  -     CBC with Differential -     Comprehensive metabolic panel -     Lipid panel -     TSH -     PSA  BMI 26.0-26.9,adult Diet and exercise. Labs pending as below.  -     CBC with Differential -     Comprehensive metabolic panel -     Lipid panel -     TSH  Colon cancer screening -     Cologuard  Screening for prostate cancer Patient aware of limitations. Denies symptoms.  -     PSA  Declined smoking cessation Not ready to quit.   Erectile dysfunction, unspecified erectile dysfunction type Labs pending as below. Sildenafil ordered.  -     CBC with Differential -     Comprehensive metabolic panel -     TSH -     sildenafil (VIAGRA) 100 MG tablet; Take 0.5-1 tablets (50-100 mg total) by mouth daily as needed for erectile dysfunction.   Counseled on healthy lifestyle choices, including diet (rich in fruits, vegetables and lean meats and low in salt and simple carbohydrates) and exercise (at least 30 minutes of moderate physical activity daily).  Patient to follow up in 1 year for annual exam or sooner if needed.  The above assessment and management plan was discussed with the patient. The patient verbalized understanding of and has agreed to the management plan. Patient is aware to call the clinic if symptoms persist or worsen. Patient is aware when to return to the clinic for a follow-up visit. Patient educated on when it is appropriate to go to the emergency department.   Harlow Mares, FNP-C [facility] [address] [place], [place] [address] [phone]

## 2020-09-18 LAB — CBC WITH DIFFERENTIAL/PLATELET
Basophils Absolute: 0.1 10*3/uL (ref 0.0–0.2)
Basos: 2 %
EOS (ABSOLUTE): 0.3 10*3/uL (ref 0.0–0.4)
Hematocrit: 42 % (ref 37.5–51.0)
Immature Granulocytes: 0 %
Lymphocytes Absolute: 1.9 10*3/uL (ref 0.7–3.1)
Lymphs: 33 %
MCH: 33.6 pg — ABNORMAL HIGH (ref 26.6–33.0)
MCHC: 34.8 g/dL (ref 31.5–35.7)
Monocytes Absolute: 0.7 10*3/uL (ref 0.1–0.9)
Monocytes: 11 %
Neutrophils: 50 %
Platelets: 240 10*3/uL (ref 150–450)
RBC: 4.35 x10E6/uL (ref 4.14–5.80)
RDW: 12.4 % (ref 11.6–15.4)
WBC: 5.9 10*3/uL (ref 3.4–10.8)

## 2020-09-18 LAB — COMPREHENSIVE METABOLIC PANEL
ALT: 23 IU/L (ref 0–44)
AST: 24 IU/L (ref 0–40)
Albumin/Globulin Ratio: 1.9 (ref 1.2–2.2)
Albumin: 4.7 g/dL (ref 3.8–4.9)
Alkaline Phosphatase: 59 IU/L (ref 44–121)
BUN/Creatinine Ratio: 12 (ref 9–20)
BUN: 9 mg/dL (ref 6–24)
Bilirubin Total: 0.4 mg/dL (ref 0.0–1.2)
CO2: 19 mmol/L — ABNORMAL LOW (ref 20–29)
Calcium: 9.6 mg/dL (ref 8.7–10.2)
Chloride: 104 mmol/L (ref 96–106)
Creatinine, Ser: 0.78 mg/dL (ref 0.76–1.27)
GFR calc Af Amer: 115 mL/min/{1.73_m2} (ref >59)
GFR calc non Af Amer: 99 mL/min/{1.73_m2} (ref >59)
Potassium: 4.5 mmol/L (ref 3.5–5.2)
Sodium: 143 mmol/L (ref 134–144)

## 2020-09-18 LAB — PSA: Prostate Specific Ag, Serum: 1.3 ng/mL (ref 0.0–4.0)

## 2020-09-18 LAB — LIPID PANEL
Cholesterol, Total: 214 mg/dL — ABNORMAL HIGH (ref 100–199)
HDL: 90 mg/dL (ref >39)
LDL Chol Calc (NIH): 117 mg/dL — ABNORMAL HIGH (ref 0–99)
Triglycerides: 41 mg/dL (ref 0–149)
VLDL Cholesterol Cal: 7 mg/dL (ref 5–40)

## 2020-09-18 LAB — TSH: TSH: 1.55 u[IU]/mL (ref 0.450–4.500)

## 2020-09-20 ENCOUNTER — Other Ambulatory Visit: Payer: Self-pay | Admitting: Family Medicine

## 2020-09-20 DIAGNOSIS — E7800 Pure hypercholesterolemia, unspecified: Secondary | ICD-10-CM

## 2020-09-20 MED ORDER — ROSUVASTATIN CALCIUM 10 MG PO TABS: 10.0000 mg | ORAL_TABLET | Freq: Every day | ORAL | 3 refills | Status: DC

## 2020-10-02 DIAGNOSIS — Z1211 Encounter for screening for malignant neoplasm of colon: Secondary | ICD-10-CM | POA: Diagnosis not present

## 2020-10-15 ENCOUNTER — Ambulatory Visit: Payer: BC Managed Care – PPO | Admitting: Family

## 2020-10-15 ENCOUNTER — Encounter: Payer: Self-pay | Admitting: Family

## 2020-10-15 VITALS — BP 117/68 | HR 67 | Temp 98.3°F

## 2020-10-15 DIAGNOSIS — R059 Cough, unspecified: Secondary | ICD-10-CM

## 2020-10-15 DIAGNOSIS — J441 Chronic obstructive pulmonary disease with (acute) exacerbation: Secondary | ICD-10-CM

## 2020-10-15 LAB — COLOGUARD: Cologuard: NEGATIVE

## 2020-10-15 MED ORDER — AZITHROMYCIN 250 MG PO TABS
ORAL_TABLET | ORAL | 0 refills | Status: DC
Start: 2020-10-15 — End: 2021-06-10

## 2020-10-15 MED ORDER — PREDNISONE 10 MG (21) PO TBPK
ORAL_TABLET | ORAL | 0 refills | Status: DC
Start: 2020-10-15 — End: 2021-06-10

## 2020-10-15 MED ORDER — BENZONATATE 200 MG PO CAPS: 200.0000 mg | ORAL_CAPSULE | Freq: Three times a day (TID) | ORAL | 1 refills | Status: DC | PRN

## 2020-10-15 MED ORDER — ALBUTEROL SULFATE HFA 108 (90 BASE) MCG/ACT IN AERS: 2.0000 | INHALATION_SPRAY | Freq: Four times a day (QID) | RESPIRATORY_TRACT | 2 refills | Status: DC | PRN

## 2020-10-15 NOTE — Progress Notes (Signed)
Subjective:    Patient ID: Rodney Sims, male    DOB: 1961/08/02, 60 y.o.   MRN: [id number]  Chief Complaint  Patient presents with  . Cough    X 1 week   . Fever    Only Thursday    PT presents to the office today with cough. He states he has taken a home COVID test that was negative.  Cough The current episode started 1 to 4 weeks ago. The problem has been gradually worsening. The problem occurs every few minutes. The cough is non-productive. Associated symptoms include chills, a fever, headaches, myalgias, nasal congestion, shortness of breath and wheezing. Pertinent negatives include no ear congestion, ear pain or sore throat. The symptoms are aggravated by lying down. Risk factors for lung disease include smoking/tobacco exposure. He has tried rest and OTC cough suppressant for the symptoms. The treatment provided mild relief.  Fever  Associated symptoms include coughing, headaches and wheezing. Pertinent negatives include no ear pain or sore throat.  Nicotine Dependence Presents for follow-up visit. Symptoms are negative for sore throat. His urge triggers include company of smokers. The symptoms have been stable. He smokes 1 pack of cigarettes per day.      Review of Systems  Constitutional: Positive for chills and fever.  HENT: Negative for ear pain and sore throat.   Respiratory: Positive for cough, shortness of breath and wheezing.   Musculoskeletal: Positive for myalgias.  Neurological: Positive for headaches.  All other systems reviewed and are negative.      Objective:   Physical Exam Vitals reviewed.  Constitutional:      General: He is not in acute distress.    Appearance: He is well-developed.  HENT:     Head: Normocephalic.     Right Ear: Tympanic membrane normal.     Left Ear: Tympanic membrane normal.  Eyes:     General:        Right eye: No discharge.        Left eye: No discharge.     Pupils: Pupils are equal, round, and reactive to light.   Neck:     Thyroid: No thyromegaly.  Cardiovascular:     Rate and Rhythm: Normal rate and regular rhythm.     Heart sounds: Normal heart sounds. No murmur heard.   Pulmonary:     Effort: Pulmonary effort is normal. No respiratory distress.     Breath sounds: Wheezing and rhonchi present.  Abdominal:     General: Bowel sounds are normal. There is no distension.     Palpations: Abdomen is soft.     Tenderness: There is no abdominal tenderness.  Musculoskeletal:        General: No tenderness. Normal range of motion.     Cervical back: Normal range of motion and neck supple.  Skin:    General: Skin is warm and dry.     Findings: No erythema or rash.  Neurological:     Mental Status: He is alert and oriented to person, place, and time.     Cranial Nerves: No cranial nerve deficit.     Deep Tendon Reflexes: Reflexes are normal and symmetric.  Psychiatric:        Behavior: Behavior normal.        Thought Content: Thought content normal.        Judgment: Judgment normal.          BP 117/68   Pulse 67   Temp 98.3 F (36.8 C) (  Temporal)   SpO2 97%   Assessment & Plan:  Rodney Sims comes in today with chief complaint of Cough (X 1 week/) and Fever (Only Thursday )   Diagnosis and orders addressed:  1. Cough - Novel Coronavirus, NAA ([facility]) - predniSONE (STERAPRED UNI-PAK 21 TAB) 10 MG (21) TBPK tablet; Use as directed  Dispense: 21 tablet; Refill: 0 - azithromycin (ZITHROMAX) 250 MG tablet; Take 500 mg once, then 250 mg for four days  Dispense: 6 tablet; Refill: 0 - benzonatate (TESSALON) 200 MG capsule; Take 1 capsule (200 mg total) by mouth 3 (three) times daily as needed.  Dispense: 30 capsule; Refill: 1  2. COPD exacerbation (HCC) - Take meds as prescribed - Use a cool mist humidifier  -Use saline nose sprays frequently -Force fluids -For any cough or congestion  Use plain Mucinex- regular strength or max strength is fine -For fever or aces or pains- take  tylenol or ibuprofen. -Throat lozenges if help -RTO if symptoms worsen or do not improve  - predniSONE (STERAPRED UNI-PAK 21 TAB) 10 MG (21) TBPK tablet; Use as directed  Dispense: 21 tablet; Refill: 0 - azithromycin (ZITHROMAX) 250 MG tablet; Take 500 mg once, then 250 mg for four days  Dispense: 6 tablet; Refill: 0 - benzonatate (TESSALON) 200 MG capsule; Take 1 capsule (200 mg total) by mouth 3 (three) times daily as needed.  Dispense: 30 capsule; Refill: 1 - albuterol (VENTOLIN HFA) 108 (90 Base) MCG/ACT inhaler; Inhale 2 puffs into the lungs every 6 (six) hours as needed for wheezing or shortness of breath.  Dispense: 8 g; Refill: 2   Jannifer Rodney, FNP

## 2020-10-15 NOTE — Patient Instructions (Signed)
Acute Bronchitis, Adult  Acute bronchitis is sudden or acute swelling of the air tubes (bronchi) in the lungs. Acute bronchitis causes these tubes to fill with mucus, which can make it hard to breathe. It can also cause coughing or wheezing. In adults, acute bronchitis usually goes away within 2 weeks. A cough caused by bronchitis may last up to 3 weeks. Smoking, allergies, and asthma can make the condition worse. What are the causes? This condition can be caused by germs and by substances that irritate the lungs, including:  Cold and flu viruses. The most common cause of this condition is the virus that causes the common cold.  Bacteria.  Substances that irritate the lungs, including: ? Smoke from cigarettes and other forms of tobacco. ? Dust and pollen. ? Fumes from chemical products, gases, or burned fuel. ? Other materials that pollute indoor or outdoor air.  Close contact with someone who has acute bronchitis. What increases the risk? The following factors may make you more likely to develop this condition:  A weak body's defense system, also called the immune system.  A condition that affects your lungs and breathing, such as asthma. What are the signs or symptoms? Common symptoms of this condition include:  Lung and breathing problems, such as: ? Coughing. This may bring up clear, yellow, or green mucus from your lungs (sputum). ? Wheezing. ? Having too much mucus in your lungs (chest congestion). ? Having shortness of breath.  A fever.  Chills.  Aches and pains, including: ? Tightness in your chest and other body aches. ? A sore throat. How is this diagnosed? This condition is usually diagnosed based on:  Your symptoms and medical history.  A physical exam. You may also have other tests, including tests to rule out other conditions, such pneumonia. These tests include:  A test of lung function.  Test of a mucus sample to look for the presence of  bacteria.  Tests to check the oxygen level in your blood.  Blood tests.  Chest X-ray. How is this treated? Most cases of acute bronchitis clear up over time without treatment. Your health care provider may recommend:  Drinking more fluids. This can thin your mucus, which may improve your breathing.  Taking a medicine for a fever or cough.  Using a device that gets medicine into your lungs (inhaler) to help improve breathing and control coughing.  Using a vaporizer or a humidifier. These are machines that add water to the air to help you breathe better. Follow these instructions at home: Activity  Get plenty of rest.  Return to your normal activities as told by your health care provider. Ask your health care provider what activities are safe for you. Lifestyle  Drink enough fluid to keep your urine pale yellow.  Do not drink alcohol.  Do not use any products that contain nicotine or tobacco, such as cigarettes, e-cigarettes, and chewing tobacco. If you need help quitting, ask your health care provider. Be aware that: ? Your bronchitis will get worse if you smoke or breathe in other people's smoke (secondhand smoke). ? Your lungs will heal faster if you quit smoking. General instructions  Take over-the-counter and prescription medicines only as told by your health care provider.  Use an inhaler, vaporizer, or humidifier as told by your health care provider.  If you have a sore throat, gargle with a salt-water mixture 3-4 times a day or as needed. To make a salt-water mixture, completely dissolve -1 tsp (3-6 g)   of salt in 1 cup (237 mL) of warm water.  Keep all follow-up visits as told by your health care provider. This is important.   How is this prevented? To lower your risk of getting this condition again:  Wash your hands often with soap and water. If soap and water are not available, use hand sanitizer.  Avoid contact with people who have cold symptoms.  Try not to  touch your mouth, nose, or eyes with your hands.  Avoid places where there are fumes from chemicals. Breathing these fumes will make your condition worse.  Get the flu shot every year.   Contact a health care provider if:  Your symptoms do not improve after 2 weeks of treatment.  You vomit more than once or twice.  You have symptoms of dehydration such as: ? Dark urine. ? Dry skin or eyes. ? Increased thirst. ? Headaches. ? Confusion. ? Muscle cramps. Get help right away if you:  Cough up blood.  Feel pain in your chest.  Have severe shortness of breath.  Faint or keep feeling like you are going to faint.  Have a severe headache.  Have fever or chills that get worse. These symptoms may represent a serious problem that is an emergency. Do not wait to see if the symptoms will go away. Get medical help right away. Call your local emergency services (911 in the [place] Do not drive yourself to the hospital. Summary  Acute bronchitis is sudden (acute) inflammation of the air tubes (bronchi) between the windpipe and the lungs. In adults, acute bronchitis usually goes away within 2 weeks, although coughing may last 3 weeks or longer  Take over-the-counter and prescription medicines only as told by your health care provider.  Drink enough fluid to keep your urine pale yellow.  Contact a health care provider if your symptoms do not improve after 2 weeks of treatment.  Get help right away if you cough up blood, faint, or have chest pain or shortness of breath. This information is not intended to replace advice given to you by your health care provider. Make sure you discuss any questions you have with your health care provider. Document Revised: 04/07/2019 Document Reviewed: 02/14/2019 Elsevier Patient Education  2021 [employer]  

## 2020-10-16 LAB — SARS-COV-2, NAA 2 DAY TAT

## 2020-10-16 LAB — NOVEL CORONAVIRUS, NAA: SARS-CoV-2, NAA: NOT DETECTED

## 2021-06-09 ENCOUNTER — Encounter: Payer: Self-pay | Admitting: *Deleted

## 2021-06-10 ENCOUNTER — Other Ambulatory Visit: Payer: Self-pay

## 2021-06-10 ENCOUNTER — Ambulatory Visit: Payer: BC Managed Care – PPO | Admitting: Family Medicine

## 2021-06-10 ENCOUNTER — Encounter: Payer: Self-pay | Admitting: Family Medicine

## 2021-06-10 VITALS — BP 137/71 | HR 69 | Temp 97.3°F | Ht 70.0 in | Wt 184.1 lb

## 2021-06-10 DIAGNOSIS — R252 Cramp and spasm: Secondary | ICD-10-CM

## 2021-06-10 DIAGNOSIS — R5383 Other fatigue: Secondary | ICD-10-CM | POA: Diagnosis not present

## 2021-06-10 MED ORDER — CYCLOBENZAPRINE HCL 10 MG PO TABS: 10.0000 mg | ORAL_TABLET | Freq: Three times a day (TID) | ORAL | 1 refills | Status: DC | PRN

## 2021-06-10 NOTE — Patient Instructions (Signed)
Leg Cramps Leg cramps occur when one or more muscles tighten and a person has no control over it (involuntary muscle contraction). Muscle cramps are most common in the calf muscles of the leg. They can occur during exercise or at rest. Leg cramps are painful, and they may last for a few seconds to a few minutes. Cramps may return several times before they finallystop. Usually, leg cramps are not caused by a serious medical problem. In many cases, the cause is not known. Some common causes include: Excessive physical effort (overexertion), such as during intense exercise. Doing the same motion over and over. Staying in a certain position for a long period of time. Improper preparation, form, or technique while doing a sport or an activity. Dehydration. Injury. Side effects of certain medicines. Abnormally low levels of minerals in your blood (electrolytes), especially potassium and calcium. This could result from: Pregnancy. Taking diuretic medicines. Follow these instructions at home: Eating and drinking Drink enough fluid to keep your urine pale yellow. Staying hydrated may help prevent cramps. Eat a healthy diet that includes plenty of nutrients to help your muscles function. A healthy diet includes fruits and vegetables, lean protein, whole grains, and low-fat or nonfat dairy products. Managing pain, stiffness, and swelling     Try massaging, stretching, and relaxing the affected muscle. Do this for several minutes at a time. If directed, put ice on areas that are sore or painful after a cramp. To do this: Put ice in a plastic bag. Place a towel between your skin and the bag. Leave the ice on for 20 minutes, 2-3 times a day. Remove the ice if your skin turns bright red. This is very important. If you cannot feel pain, heat, or cold, you have a greater risk of damage to the area. If directed, apply heat to muscles that are tense or tight. Do this before you exercise, or as often as told  by your health care provider. Use the heat source that your health care provider recommends, such as a moist heat pack or a heating pad. To do this: Place a towel between your skin and the heat source. Leave the heat on for 20-30 minutes. Remove the heat if your skin turns bright red. This is especially important if you are unable to feel pain, heat, or cold. You may have a greater risk of getting burned. Try taking hot showers or baths to help relax tight muscles. General instructions If you are having frequent leg cramps, avoid intense exercise for several days. Take over-the-counter and prescription medicines only as told by your health care provider. Keep all follow-up visits. This is important. Contact a health care provider if: Your leg cramps get more severe or more frequent, or they do not improve over time. Your foot becomes cold, numb, or blue. Summary Muscle cramps can develop in any muscle, but the most common place is in the calf muscles of the leg. Leg cramps are painful, and they may last for a few seconds to a few minutes. Usually, leg cramps are not caused by a serious medical problem. Often, the cause is not known. Stay hydrated, and take over-the-counter and prescription medicines only as told by your health care provider. This information is not intended to replace advice given to you by your health care provider. Make sure you discuss any questions you have with your healthcare provider. Document Revised: 12/10/2019 Document Reviewed: 12/10/2019 Elsevier Patient Education  2022 [employer]  

## 2021-06-10 NOTE — Progress Notes (Signed)
 Acute Office Visit  Subjective:    Patient ID: Rodney Sims, male    DOB: 12/19/1960, 60 y.o.   MRN: [id number]  Chief Complaint  Patient presents with  . leg cramp    HPI Patient is in today for leg cramps that occur at night for the last 2 months. It occurs multiple episodes of this at night. It occurs along his shin and makes his foot flex. He denies pain in his leg other than during the cramping. He does feel like the muscle quivers during the day. He denies numbness, tingling, burning, radiating pain, erythema, warmth, edema, decreased ROM, or injury. He does have a history of lower back pain. He has tried stretching and CBD oil without improvement. He stands of concrete floors at work. He does wear supportive shoes. Denies restless leg symptoms. He stays well hydrated and drinks at least 84 ounces of water a day.  He has also had increased fatigue for the last few months. He does sleep well at night.   Past Medical History:  Diagnosis Date  . Allergy     No past surgical history on file.  Family History  Problem Relation Age of Onset  . Hyperlipidemia Mother   . Heart disease Father   . Parkinson's disease Father     Social History   Socioeconomic History  . Marital status: Married    Spouse name: Not on file  . Number of children: 1  . Years of education: Not on file  . Highest education level: Not on file  Occupational History  . Occupation: maintanence  Tobacco Use  . Smoking status: Every Day    Packs/day: 1.50    Years: 27.00    Pack years: 40.50    Types: Cigarettes  . Smokeless tobacco: Never  Vaping Use  . Vaping Use: Former  Substance and Sexual Activity  . Alcohol use: Not on file  . Drug use: Never  . Sexual activity: Not on file  Other Topics Concern  . Not on file  Social History Narrative  . Not on file   Social Determinants of Health   Financial Resource Strain: Not on file  Food Insecurity: Not on file  Transportation Needs: Not  on file  Physical Activity: Not on file  Stress: Not on file  Social Connections: Not on file  Intimate Partner Violence: Not on file    Outpatient Medications Prior to Visit  Medication Sig Dispense Refill  . albuterol (VENTOLIN HFA) 108 (90 Base) MCG/ACT inhaler Inhale 2 puffs into the lungs every 6 (six) hours as needed for wheezing or shortness of breath. 8 g 2  . Fexofenadine HCl (ALLEGRA PO) Take by mouth.    . fluticasone (FLONASE) 50 MCG/ACT nasal spray Place into both nostrils daily.    . rosuvastatin (CRESTOR) 10 MG tablet Take 1 tablet (10 mg total) by mouth daily. (Patient not taking: Reported on 06/10/2021) 90 tablet 3  . azithromycin (ZITHROMAX) 250 MG tablet Take 500 mg once, then 250 mg for four days 6 tablet 0  . benzonatate (TESSALON) 200 MG capsule Take 1 capsule (200 mg total) by mouth 3 (three) times daily as needed. 30 capsule 1  . predniSONE (STERAPRED UNI-PAK 21 TAB) 10 MG (21) TBPK tablet Use as directed 21 tablet 0  . sildenafil (VIAGRA) 100 MG tablet Take 0.5-1 tablets (50-100 mg total) by mouth daily as needed for erectile dysfunction. 5 tablet 11   No facility-administered medications prior to visit.      Allergies  Allergen Reactions  . Penicillins     Caused seizures as child  . Bee Venom Hives  . Betamethasone   . Codeine     Review of Systems As per HPI.     Objective:    Physical Exam Vitals and nursing note reviewed.  Constitutional:      General: He is not in acute distress.    Appearance: He is not ill-appearing, toxic-appearing or diaphoretic.  Pulmonary:     Effort: Pulmonary effort is normal. No respiratory distress.  Musculoskeletal:     Lumbar back: Negative right straight leg raise test.     Right lower leg: No swelling, deformity, lacerations, tenderness or bony tenderness. No edema.     Left lower leg: No edema.  Skin:    General: Skin is warm and dry.  Neurological:     General: No focal deficit present.     Mental Status:  He is alert and oriented to person, place, and time.     Motor: No weakness.     Gait: Gait normal.  Psychiatric:        Mood and Affect: Mood normal.        Behavior: Behavior normal.    BP 137/71   Pulse 69   Temp (!) 97.3 F (36.3 C) (Temporal)   Ht 5' 10" (1.778 m)   Wt 184 lb 2 oz (83.5 kg)   BMI 26.42 kg/m  Wt Readings from Last 3 Encounters:  06/10/21 184 lb 2 oz (83.5 kg)  09/17/20 186 lb 6.4 oz (84.6 kg)  08/20/20 189 lb 4 oz (85.8 kg)    Health Maintenance Due  Topic Date Due  . COLONOSCOPY (Pts 45-63yr Insurance coverage will need to be confirmed)  Never done    There are no preventive care reminders to display for this patient.   Lab Results  Component Value Date   TSH 1.550 09/17/2020   Lab Results  Component Value Date   WBC 5.9 09/17/2020   HGB 14.6 09/17/2020   HCT 42.0 09/17/2020   MCV 97 09/17/2020   PLT 240 09/17/2020   Lab Results  Component Value Date   NA 143 09/17/2020   K 4.5 09/17/2020   CO2 19 (L) 09/17/2020   GLUCOSE 95 09/17/2020   BUN 9 09/17/2020   CREATININE 0.78 09/17/2020   BILITOT 0.4 09/17/2020   ALKPHOS 59 09/17/2020   AST 24 09/17/2020   ALT 23 09/17/2020   PROT 7.2 09/17/2020   ALBUMIN 4.7 09/17/2020   CALCIUM 9.6 09/17/2020   Lab Results  Component Value Date   CHOL 214 (H) 09/17/2020   Lab Results  Component Value Date   HDL 90 09/17/2020   Lab Results  Component Value Date   LDLCALC 117 (H) 09/17/2020   Lab Results  Component Value Date   TRIG 41 09/17/2020   Lab Results  Component Value Date   CHOLHDL 2.4 09/17/2020   No results found for: HGBA1C     Assessment & Plan:   RRaydellwas seen today for leg cramp.  Diagnoses and all orders for this visit:  Leg cramps Labs pending as below. Will try flexeril. Discussed exercising, stretching, compression socks.  -     Magnesium -     Thyroid Panel With TSH -     BMP8+EGFR -     Anemia Profile B -     cyclobenzaprine (FLEXERIL) 10 MG tablet;  Take 1 tablet (10 mg total) by mouth 3 (  three) times daily as needed for muscle spasms.  Other fatigue Labs pending.  -     Magnesium -     Thyroid Panel With TSH -     BMP8+EGFR -     Anemia Profile B  Return in about 6 months (around 12/08/2021) for CPE.  The patient indicates understanding of these issues and agrees with the plan.  Tiffany M Morgan, FNP  

## 2021-06-11 LAB — ANEMIA PROFILE B
Basophils Absolute: 0.1 10*3/uL (ref 0.0–0.2)
Basos: 1 %
EOS (ABSOLUTE): 0.2 10*3/uL (ref 0.0–0.4)
Eos: 4 %
Ferritin: 138 ng/mL (ref 30–400)
Folate: 11.9 ng/mL (ref >3.0)
Hematocrit: 41.4 % (ref 37.5–51.0)
Hemoglobin: 14.2 g/dL (ref 13.0–17.7)
Immature Grans (Abs): 0 10*3/uL (ref 0.0–0.1)
Immature Granulocytes: 0 %
Iron Saturation: 28 % (ref 15–55)
Iron: 81 ug/dL (ref 38–169)
Lymphocytes Absolute: 1.5 10*3/uL (ref 0.7–3.1)
Lymphs: 25 %
MCH: 33.3 pg — ABNORMAL HIGH (ref 26.6–33.0)
MCHC: 34.3 g/dL (ref 31.5–35.7)
MCV: 97 fL (ref 79–97)
Monocytes Absolute: 0.8 10*3/uL (ref 0.1–0.9)
Monocytes: 13 %
Neutrophils Absolute: 3.4 10*3/uL (ref 1.4–7.0)
Neutrophils: 57 %
Platelets: 229 10*3/uL (ref 150–450)
RBC: 4.27 x10E6/uL (ref 4.14–5.80)
RDW: 12.6 % (ref 11.6–15.4)
Retic Ct Pct: 1.1 % (ref 0.6–2.6)
Total Iron Binding Capacity: 289 ug/dL (ref 250–450)
UIBC: 208 ug/dL (ref 111–343)
Vitamin B-12: 479 pg/mL (ref 232–1245)
WBC: 6 10*3/uL (ref 3.4–10.8)

## 2021-06-11 LAB — THYROID PANEL WITH TSH
Free Thyroxine Index: 1.6 (ref 1.2–4.9)
T3 Uptake Ratio: 34 % (ref 24–39)
T4, Total: 4.6 ug/dL (ref 4.5–12.0)
TSH: 1.66 u[IU]/mL (ref 0.450–4.500)

## 2021-06-11 LAB — BMP8+EGFR
BUN/Creatinine Ratio: 14 (ref 9–20)
BUN: 12 mg/dL (ref 6–24)
CO2: 28 mmol/L (ref 20–29)
Calcium: 9.7 mg/dL (ref 8.7–10.2)
Chloride: 104 mmol/L (ref 96–106)
Creatinine, Ser: 0.84 mg/dL (ref 0.76–1.27)
Glucose: 85 mg/dL (ref 70–99)
Potassium: 5.1 mmol/L (ref 3.5–5.2)
Sodium: 143 mmol/L (ref 134–144)
eGFR: 101 mL/min/{1.73_m2} (ref >59)

## 2021-06-11 LAB — MAGNESIUM: Magnesium: 1.9 mg/dL (ref 1.6–2.3)

## 2021-09-23 ENCOUNTER — Encounter: Payer: BC Managed Care – PPO | Admitting: Family Medicine

## 2021-09-23 ENCOUNTER — Ambulatory Visit (INDEPENDENT_AMBULATORY_CARE_PROVIDER_SITE_OTHER): Payer: BC Managed Care – PPO | Admitting: Family Medicine

## 2021-09-23 ENCOUNTER — Encounter: Payer: Self-pay | Admitting: Family Medicine

## 2021-09-23 VITALS — BP 143/74 | HR 61 | Temp 98.0°F | Ht 70.0 in | Wt 181.0 lb

## 2021-09-23 DIAGNOSIS — Z0001 Encounter for general adult medical examination with abnormal findings: Secondary | ICD-10-CM | POA: Diagnosis not present

## 2021-09-23 DIAGNOSIS — Z0000 Encounter for general adult medical examination without abnormal findings: Secondary | ICD-10-CM | POA: Diagnosis not present

## 2021-09-23 DIAGNOSIS — Z720 Tobacco use: Secondary | ICD-10-CM | POA: Diagnosis not present

## 2021-09-23 DIAGNOSIS — E7800 Pure hypercholesterolemia, unspecified: Secondary | ICD-10-CM

## 2021-09-23 LAB — LIPID PANEL

## 2021-09-23 MED ORDER — ROSUVASTATIN CALCIUM 10 MG PO TABS: 10.0000 mg | ORAL_TABLET | Freq: Every day | ORAL | 3 refills | Status: DC

## 2021-09-23 NOTE — Progress Notes (Signed)
Assessment & Plan:  1. Well adult exam Preventive health education provided. - CBC with Differential/Platelet - CMP14+EGFR - Lipid panel  2. Pure hypercholesterolemia Advised to start rosuvastatin.  - Lipid panel - rosuvastatin (CRESTOR) 10 MG tablet; Take 1 tablet (10 mg total) by mouth daily.  Dispense: 90 tablet; Refill: 3  3. Tobacco abuse Encouraged smoking cessation. - CT CHEST LUNG CA SCREEN LOW DOSE W/O CM; Future   Follow-up: Return in about 6 months (around 03/23/2022) for Cholesterol.   Rodney Limes, MSN, APRN, FNP-C [facility]  Subjective:  Patient ID: Rodney Sims, male    DOB: March 16, 1961  Age: 61 y.o. MRN: [id number]  Patient Care Team: Gwenlyn Perking, FNP as PCP - General (Family Medicine)   CC:  Chief Complaint  Patient presents with   Annual Exam    cpe    HPI [name] presents for his annual physical.   Occupation: maintenance, Marital status: married, Substance use: none Diet: regular, Exercise: walking Last eye exam: a year ago Last dental exam: couple years ago Last cologuard: 10/02/2020 - with repeat in 3 years Lung Cancer Screening with low-dose Chest CT: agreeable in completing AAA Screening: not eligible until 59 years of age Hepatitis C Screening: negative on 09/19/2019 PSA: WNL on 09/17/2020 Immunizations: Flu Vaccine: declined Tdap Vaccine: declined  Shingrix Vaccine: declined  COVID-19 Vaccine: declined Pneumonia Vaccine: declined  DEPRESSION SCREENING PHQ 2/9 Scores 09/23/2021 06/10/2021 09/17/2020 08/20/2020 11/10/2019 09/19/2019  PHQ - 2 Score 0 0 0 0 0 0  PHQ- 9 Score 0 0 - - - -    Hyperlipidemia: patient was to start rosuvastatin after his labs resulted last year, but he was not aware of this, so it was never picked up from the pharmacy.   The 10-year ASCVD risk score (Arnett DK, et al., 2019) is: 10.8%   Values used to calculate the score:     Age: 31 years     Sex: Male     Is Non-Hispanic  African American: No     Diabetic: No     Tobacco smoker: Yes     Systolic Blood Pressure: 829 mmHg     Is BP treated: No     HDL Cholesterol: 90 mg/dL     Total Cholesterol: 214 mg/dL  Review of Systems  Constitutional:  Negative for chills, fever, malaise/fatigue and weight loss.  HENT:  Negative for congestion, ear discharge, ear pain, nosebleeds, sinus pain, sore throat and tinnitus.   Eyes:  Negative for blurred vision, double vision, pain, discharge and redness.  Respiratory:  Negative for cough, shortness of breath and wheezing.   Cardiovascular:  Negative for chest pain, palpitations and leg swelling.  Gastrointestinal:  Negative for abdominal pain, constipation, diarrhea, heartburn, nausea and vomiting.  Genitourinary:  Negative for dysuria, frequency and urgency.       Denies trouble initiating a urine stream, weak stream, split stream, and nocturia. He does have dribbling.  Musculoskeletal:  Negative for myalgias.  Skin:  Negative for rash.  Neurological:  Negative for dizziness, seizures, weakness and headaches.  Psychiatric/Behavioral:  Negative for depression, substance abuse and suicidal ideas. The patient is not nervous/anxious.     Current Outpatient Medications:    Fexofenadine HCl (ALLEGRA PO), Take by mouth., Disp: , Rfl:    fluticasone (FLONASE) 50 MCG/ACT nasal spray, Place into both nostrils daily., Disp: , Rfl:    albuterol (VENTOLIN HFA) 108 (90 Base) MCG/ACT inhaler, Inhale 2 puffs into  the lungs every 6 (six) hours as needed for wheezing or shortness of breath. (Patient not taking: Reported on 09/23/2021), Disp: 8 g, Rfl: 2   cyclobenzaprine (FLEXERIL) 10 MG tablet, Take 1 tablet (10 mg total) by mouth 3 (three) times daily as needed for muscle spasms. (Patient not taking: Reported on 09/23/2021), Disp: 90 tablet, Rfl: 1   rosuvastatin (CRESTOR) 10 MG tablet, Take 1 tablet (10 mg total) by mouth daily. (Patient not taking: Reported on 06/10/2021), Disp: 90 tablet,  Rfl: 3  Allergies  Allergen Reactions   Penicillins     Caused seizures as child   Bee Venom Hives   Betamethasone    Codeine     Past Medical History:  Diagnosis Date   Allergy     History reviewed. No pertinent surgical history.  Family History  Problem Relation Age of Onset   Hyperlipidemia Mother    Heart disease Father    Parkinson's disease Father     Social History   Socioeconomic History   Marital status: Married    Spouse name: Not on file   Number of children: 1   Years of education: Not on file   Highest education level: Not on file  Occupational History   Occupation: maintanence  Tobacco Use   Smoking status: Every Day    Packs/day: 1.50    Years: 27.00    Pack years: 40.50    Types: Cigarettes   Smokeless tobacco: Never  Vaping Use   Vaping Use: Former  Substance and Sexual Activity   Alcohol use: Not on file   Drug use: Never   Sexual activity: Not on file  Other Topics Concern   Not on file  Social History Narrative   Not on file   Social Determinants of Health   Financial Resource Strain: Not on file  Food Insecurity: Not on file  Transportation Needs: Not on file  Physical Activity: Not on file  Stress: Not on file  Social Connections: Not on file  Intimate Partner Violence: Not on file      Objective:    BP (!) 143/74    Pulse 61    Temp 98 F (36.7 C) (Temporal)    Ht 5' 10"  (1.778 m)    Wt 181 lb (82.1 kg)    SpO2 98%    BMI 25.97 kg/m   Wt Readings from Last 3 Encounters:  09/23/21 181 lb (82.1 kg)  06/10/21 184 lb 2 oz (83.5 kg)  09/17/20 186 lb 6.4 oz (84.6 kg)    Physical Exam Vitals reviewed.  Constitutional:      General: He is not in acute distress.    Appearance: Normal appearance. He is normal weight. He is not ill-appearing, toxic-appearing or diaphoretic.  HENT:     Head: Normocephalic and atraumatic.     Right Ear: Tympanic membrane, ear canal and external ear normal. There is no impacted cerumen.      Left Ear: Tympanic membrane, ear canal and external ear normal. There is no impacted cerumen.     Nose: Nose normal. No congestion or rhinorrhea.     Mouth/Throat:     Mouth: Mucous membranes are moist.     Pharynx: Oropharynx is clear. No oropharyngeal exudate or posterior oropharyngeal erythema.  Eyes:     General: No scleral icterus.       Right eye: No discharge.        Left eye: No discharge.     Conjunctiva/sclera: Conjunctivae normal.  Pupils: Pupils are equal, round, and reactive to light.  Neck:     Vascular: No carotid bruit.  Cardiovascular:     Rate and Rhythm: Normal rate and regular rhythm.     Heart sounds: Normal heart sounds. No murmur heard.   No friction rub. No gallop.  Pulmonary:     Effort: Pulmonary effort is normal. No respiratory distress.     Breath sounds: Normal breath sounds. No stridor. No wheezing, rhonchi or rales.  Abdominal:     General: Abdomen is flat. Bowel sounds are normal. There is no distension.     Palpations: Abdomen is soft. There is no hepatomegaly, splenomegaly or mass.     Tenderness: There is no abdominal tenderness. There is no guarding or rebound.     Hernia: No hernia is present.  Musculoskeletal:        General: Normal range of motion.     Cervical back: Normal range of motion and neck supple. No rigidity. No muscular tenderness.     Right lower leg: No edema.     Left lower leg: No edema.  Lymphadenopathy:     Cervical: No cervical adenopathy.  Skin:    General: Skin is warm and dry.     Capillary Refill: Capillary refill takes less than 2 seconds.  Neurological:     General: No focal deficit present.     Mental Status: He is alert and oriented to person, place, and time. Mental status is at baseline.  Psychiatric:        Mood and Affect: Mood normal.        Behavior: Behavior normal.        Thought Content: Thought content normal.        Judgment: Judgment normal.    Lab Results  Component Value Date   TSH 1.660  06/10/2021   Lab Results  Component Value Date   WBC 6.0 06/10/2021   HGB 14.2 06/10/2021   HCT 41.4 06/10/2021   MCV 97 06/10/2021   PLT 229 06/10/2021   Lab Results  Component Value Date   NA 143 06/10/2021   K 5.1 06/10/2021   CO2 28 06/10/2021   GLUCOSE 85 06/10/2021   BUN 12 06/10/2021   CREATININE 0.84 06/10/2021   BILITOT 0.4 09/17/2020   ALKPHOS 59 09/17/2020   AST 24 09/17/2020   ALT 23 09/17/2020   PROT 7.2 09/17/2020   ALBUMIN 4.7 09/17/2020   CALCIUM 9.7 06/10/2021   EGFR 101 06/10/2021   Lab Results  Component Value Date   CHOL 214 (H) 09/17/2020   Lab Results  Component Value Date   HDL 90 09/17/2020   Lab Results  Component Value Date   LDLCALC 117 (H) 09/17/2020   Lab Results  Component Value Date   TRIG 41 09/17/2020   Lab Results  Component Value Date   CHOLHDL 2.4 09/17/2020   No results found for: HGBA1C

## 2021-09-24 LAB — CMP14+EGFR
ALT: 27 IU/L (ref 0–44)
AST: 28 IU/L (ref 0–40)
Albumin/Globulin Ratio: 2.1 (ref 1.2–2.2)
Albumin: 4.9 g/dL (ref 3.8–4.9)
Alkaline Phosphatase: 70 IU/L (ref 44–121)
BUN/Creatinine Ratio: 10 (ref 9–20)
BUN: 10 mg/dL (ref 6–24)
Bilirubin Total: 0.6 mg/dL (ref 0.0–1.2)
CO2: 26 mmol/L (ref 20–29)
Calcium: 10.1 mg/dL (ref 8.7–10.2)
Chloride: 103 mmol/L (ref 96–106)
Creatinine, Ser: 0.99 mg/dL (ref 0.76–1.27)
Globulin, Total: 2.3 g/dL (ref 1.5–4.5)
Glucose: 90 mg/dL (ref 70–99)
Potassium: 5.2 mmol/L (ref 3.5–5.2)
Sodium: 143 mmol/L (ref 134–144)
Total Protein: 7.2 g/dL (ref 6.0–8.5)
eGFR: 88 mL/min/{1.73_m2} (ref >59)

## 2021-09-24 LAB — LIPID PANEL
Chol/HDL Ratio: 2.5 ratio (ref 0.0–5.0)
Cholesterol, Total: 230 mg/dL — ABNORMAL HIGH (ref 100–199)
HDL: 93 mg/dL (ref >39)
LDL Chol Calc (NIH): 129 mg/dL — ABNORMAL HIGH (ref 0–99)
Triglycerides: 47 mg/dL (ref 0–149)
VLDL Cholesterol Cal: 8 mg/dL (ref 5–40)

## 2021-09-24 LAB — CBC WITH DIFFERENTIAL/PLATELET
Basophils Absolute: 0.1 10*3/uL (ref 0.0–0.2)
Basos: 2 %
EOS (ABSOLUTE): 0.3 10*3/uL (ref 0.0–0.4)
Eos: 5 %
Hematocrit: 44.6 % (ref 37.5–51.0)
Hemoglobin: 15.4 g/dL (ref 13.0–17.7)
Immature Grans (Abs): 0 10*3/uL (ref 0.0–0.1)
Immature Granulocytes: 0 %
Lymphocytes Absolute: 1.8 10*3/uL (ref 0.7–3.1)
Lymphs: 33 %
MCH: 33.4 pg — ABNORMAL HIGH (ref 26.6–33.0)
MCHC: 34.5 g/dL (ref 31.5–35.7)
MCV: 97 fL (ref 79–97)
Monocytes Absolute: 0.5 10*3/uL (ref 0.1–0.9)
Monocytes: 9 %
Neutrophils Absolute: 2.8 10*3/uL (ref 1.4–7.0)
Neutrophils: 51 %
Platelets: 243 10*3/uL (ref 150–450)
RBC: 4.61 x10E6/uL (ref 4.14–5.80)
RDW: 12.7 % (ref 11.6–15.4)
WBC: 5.5 10*3/uL (ref 3.4–10.8)

## 2021-09-26 ENCOUNTER — Encounter (HOSPITAL_COMMUNITY): Payer: Self-pay

## 2021-09-26 ENCOUNTER — Telehealth: Payer: Self-pay | Admitting: Family Medicine

## 2021-09-26 DIAGNOSIS — E7800 Pure hypercholesterolemia, unspecified: Secondary | ICD-10-CM

## 2021-09-26 MED ORDER — ROSUVASTATIN CALCIUM 10 MG PO TABS: 10.0000 mg | ORAL_TABLET | Freq: Every day | ORAL | 3 refills | Status: DC

## 2021-09-26 NOTE — Telephone Encounter (Signed)
Crestor resent to [facility] in [place] per pt request and pt is aware.

## 2021-09-26 NOTE — Progress Notes (Signed)
Received referral for initial lung cancer screening scan. Attempted to reach patient regarding referral, unable to reach patient at this time. Detailed VM left asking that the patient return my call.

## 2021-09-26 NOTE — Telephone Encounter (Signed)
Patient was seen Friday 2/17 and his prescription for cholesterol is not at the pharmacy.  Could it be resent please.   Crestor 10mg 

## 2021-09-26 NOTE — Telephone Encounter (Signed)
Spoke with [facility]. Tey advised that pt should contact his insurance company. States that it looks like pt had the medication filled some where else. Informed pharmacy that we sent the medication to them on 2/17 and 2/20. Pharmacy does not see the 2/17 refill request.  Informed pt to contact his insurance. Pt understood and will call back.

## 2021-10-10 ENCOUNTER — Encounter (HOSPITAL_COMMUNITY): Payer: Self-pay

## 2021-10-10 NOTE — Progress Notes (Signed)
Patient returned my call and reported that he does not wish to proceed with lung cancer screening at this time. Referral closed at patient's request ?

## 2021-10-10 NOTE — Progress Notes (Signed)
Received referral for initial lung cancer screening scan. Attempted to reach patient regarding referral, unable to reach patient at this time. Detailed VM left asking that the patient return my call. ?

## 2022-01-11 ENCOUNTER — Encounter: Payer: Self-pay | Admitting: Family Medicine

## 2022-01-11 ENCOUNTER — Ambulatory Visit: Payer: BC Managed Care – PPO | Admitting: Family Medicine

## 2022-01-11 VITALS — BP 138/82 | HR 66 | Temp 97.8°F | Resp 20 | Ht 70.0 in | Wt 178.0 lb

## 2022-01-11 DIAGNOSIS — M109 Gout, unspecified: Secondary | ICD-10-CM

## 2022-01-11 MED ORDER — COLCHICINE 0.6 MG PO TABS: ORAL_TABLET | ORAL | 0 refills | Status: DC

## 2022-01-11 MED ORDER — PREDNISONE 20 MG PO TABS: 40.0000 mg | ORAL_TABLET | Freq: Every day | ORAL | 0 refills | Status: AC

## 2022-01-11 MED ORDER — MELOXICAM 15 MG PO TABS: 15.0000 mg | ORAL_TABLET | Freq: Every day | ORAL | 0 refills | Status: DC

## 2022-01-11 NOTE — Patient Instructions (Signed)
Gout  Gout is a condition that causes painful swelling of the joints. Gout is a type of inflammation of the joints (arthritis). This condition is caused by having too much uric acid in the body. Uric acid is a chemical that forms when the body breaks down substances called purines. Purines are important for building body proteins. When the body has too much uric acid, sharp crystals can form and build up inside the joints. This causes pain and swelling. Gout attacks can happen quickly and may be very painful (acute gout). Over time, the attacks can affect more joints and become more frequent (chronic gout). Gout can also cause uric acid to build up under the skin and inside the kidneys. What are the causes? This condition is caused by too much uric acid in your blood. This can happen because: Your kidneys do not remove enough uric acid from your blood. This is the most common cause. Your body makes too much uric acid. This can happen with some cancers and cancer treatments. It can also occur if your body is breaking down too many red blood cells (hemolytic anemia). You eat too many foods that are high in purines. These foods include organ meats and some seafood. Alcohol, especially beer, is also high in purines. A gout attack may be triggered by trauma or stress. What increases the risk? The following factors may make you more likely to develop this condition: Having a family history of gout. Being male and middle-aged. Being male and having gone through menopause. Taking certain medicines, including aspirin, cyclosporine, diuretics, levodopa, and niacin. Having an organ transplant. Having certain conditions, such as: Being obese. Lead poisoning. Kidney disease. A skin condition called psoriasis. Other factors include: Losing weight too quickly. Being dehydrated. Frequently drinking alcohol, especially beer. Frequently drinking beverages that are sweetened with a type of sugar called  fructose. What are the signs or symptoms? An attack of acute gout happens quickly. It usually occurs in just one joint. The most common place is the big toe. Attacks often start at night. Other joints that may be affected include joints of the feet, ankle, knee, fingers, wrist, or elbow. Symptoms of this condition may include: Severe pain. Warmth. Swelling. Stiffness. Tenderness. The affected joint may be very painful to touch. Shiny, red, or purple skin. Chills and fever. Chronic gout may cause symptoms more frequently. More joints may be involved. You may also have white or yellow lumps (tophi) on your hands or feet or in other areas near your joints. How is this diagnosed? This condition is diagnosed based on your symptoms, your medical history, and a physical exam. You may have tests, such as: Blood tests to measure uric acid levels. Removal of joint fluid with a thin needle (aspiration) to look for uric acid crystals. X-rays to look for joint damage. How is this treated? Treatment for this condition has two phases: treating an acute attack and preventing future attacks. Acute gout treatment may include medicines to reduce pain and swelling, including: NSAIDs, such as ibuprofen. Steroids. These are strong anti-inflammatory medicines that can be taken by mouth (orally) or injected into a joint. Colchicine. This medicine relieves pain and swelling when it is taken soon after an attack. It can be given by mouth or through an IV. Preventive treatment may include: Daily use of smaller doses of NSAIDs or colchicine. Use of a medicine that reduces uric acid levels in your blood, such as allopurinol. Changes to your diet. You may need to see   a dietitian about what to eat and drink to prevent gout. Follow these instructions at home: During a gout attack  If directed, put ice on the affected area. To do this: Put ice in a plastic bag. Place a towel between your skin and the bag. Leave the  ice on for 20 minutes, 2-3 times a day. Remove the ice if your skin turns bright red. This is very important. If you cannot feel pain, heat, or cold, you have a greater risk of damage to the area. Raise (elevate) the affected joint above the level of your heart as often as possible. Rest the joint as much as possible. If the affected joint is in your leg, you may be given crutches to use. Follow instructions from your health care provider about eating or drinking restrictions. Avoiding future gout attacks Follow a low-purine diet as told by your dietitian or health care provider. Avoid foods and drinks that are high in purines, including liver, kidney, anchovies, asparagus, herring, mushrooms, mussels, and beer. Maintain a healthy weight or lose weight if you are overweight. If you want to lose weight, talk with your health care provider. Do not lose weight too quickly. Start or maintain an exercise program as told by your health care provider. Eating and drinking Avoid drinking beverages that contain fructose. Drink enough fluids to keep your urine pale yellow. If you drink alcohol: Limit how much you have to: 0-1 drink a day for women who are not pregnant. 0-2 drinks a day for men. Know how much alcohol is in a drink. In the [place] one drink equals one 12 oz bottle of beer (355 mL), one 5 oz glass of wine (148 mL), or one 1 oz glass of hard liquor (44 mL). General instructions Take over-the-counter and prescription medicines only as told by your health care provider. Ask your health care provider if the medicine prescribed to you requires you to avoid driving or using machinery. Return to your normal activities as told by your health care provider. Ask your health care provider what activities are safe for you. Keep all follow-up visits. This is important. Where to find more information National Institutes of Health: www.niams.nih.gov Contact a health care provider if you have: Another  gout attack. Continuing symptoms of a gout attack after 10 days of treatment. Side effects from your medicines. Chills or a fever. Burning pain when you urinate. Pain in your lower back or abdomen. Get help right away if you: Have severe or uncontrolled pain. Cannot urinate. Summary Gout is painful swelling of the joints caused by having too much uric acid in the body. The most common site for gout to occur is in the big toe, but it can affect other joints in the body. Medicines and dietary changes can help to prevent and treat gout attacks. This information is not intended to replace advice given to you by your health care provider. Make sure you discuss any questions you have with your health care provider. Document Revised: 04/27/2021 Document Reviewed: 04/27/2021 Elsevier Patient Education  2023 [employer]  

## 2022-01-11 NOTE — Progress Notes (Signed)
   Established Patient Office Visit  Subjective   Patient ID: Rodney Sims, male    DOB: [birth year]  Age: 61 y.o. MRN: [id number]  Chief Complaint  Patient presents with   Insect Bite    Right heel     HPI Rodney Sims reports right heel and ankle pain x 2 days. It started suddenly. The area is tender, swollen, and a little red. It is very tender to the touch. The pain is moderate to severe. It is also worse when applying weight such as with walking. He suspects a possible bug bite but did not witness any bites. He denies injury or trauma. He did take ibuprofen without improvement yesterday.   Past Medical History:  Diagnosis Date   Allergy    Hyperlipidemia    ROS As per HPI.    Objective:     BP 138/82   Pulse 66   Temp 97.8 F (36.6 C)   Resp 20   Ht 5\' 10"  (1.778 m)   Wt 178 lb (80.7 kg)   SpO2 98%   BMI 25.54 kg/m    Physical Exam Vitals and nursing note reviewed.  Constitutional:      General: He is not in acute distress.    Appearance: He is not ill-appearing, toxic-appearing or diaphoretic.  Cardiovascular:     Rate and Rhythm: Normal rate and regular rhythm.  Pulmonary:     Effort: Pulmonary effort is normal. No respiratory distress.  Musculoskeletal:     Right ankle: Swelling (lateral) present. Tenderness (lateral) present. Decreased range of motion.     Right Achilles Tendon: No tenderness or defects. Thompson's test negative.  Skin:    General: Skin is warm and dry.  Neurological:     General: No focal deficit present.     Mental Status: He is alert and oriented to person, place, and time.  Psychiatric:        Mood and Affect: Mood normal.        Behavior: Behavior normal.     No results found for any visits on 01/11/22.    The 10-year ASCVD risk score (Arnett DK, et al., 2019) is: 10.6%    Assessment & Plan:   Rodney Sims was seen today for insect bite.  Diagnoses and all orders for this visit:  Acute gout of right ankle, unspecified  cause Consist with gout. Mobic daily, do not take other NSAIDs with mobic. Colchicine also ordered and discussed with prednisone burst. Uric acid level pending, will notify patient of results. Return to office for new or worsening symptoms, or if symptoms persist.  -     meloxicam (MOBIC) 15 MG tablet; Take 1 tablet (15 mg total) by mouth daily. -     predniSONE (DELTASONE) 20 MG tablet; Take 2 tablets (40 mg total) by mouth daily with breakfast for 5 days. -     colchicine 0.6 MG tablet; Take 2 tablets by mouth now. Then repeat in 1 hour. Take one tablet daily starting tomorrow until flare resolves. -     Uric Acid   Return in about 8 months (around 09/23/2022) for CPE.  The patient indicates understanding of these issues and agrees with the plan.    09/25/2022, FNP

## 2022-01-12 LAB — URIC ACID: Uric Acid: 6.1 mg/dL (ref 3.8–8.4)

## 2022-01-12 NOTE — Progress Notes (Signed)
Patient returning call. Please call back

## 2022-02-07 ENCOUNTER — Other Ambulatory Visit: Payer: Self-pay | Admitting: Family Medicine

## 2022-02-07 DIAGNOSIS — M109 Gout, unspecified: Secondary | ICD-10-CM

## 2022-03-24 ENCOUNTER — Ambulatory Visit: Payer: BC Managed Care – PPO | Admitting: Family Medicine

## 2022-03-24 ENCOUNTER — Encounter: Payer: Self-pay | Admitting: Family Medicine

## 2022-03-24 VITALS — BP 138/75 | HR 59 | Temp 97.8°F | Ht 70.0 in | Wt 181.2 lb

## 2022-03-24 DIAGNOSIS — E7800 Pure hypercholesterolemia, unspecified: Secondary | ICD-10-CM

## 2022-03-24 DIAGNOSIS — R5383 Other fatigue: Secondary | ICD-10-CM | POA: Diagnosis not present

## 2022-03-24 NOTE — Patient Instructions (Signed)

## 2022-03-24 NOTE — Progress Notes (Signed)
 Established Patient Office Visit  Subjective   Patient ID: Rodney Sims, male    DOB: 01/18/1961  Age: 61 y.o. MRN: [id number]  Chief Complaint  Patient presents with   Medical Management of Chronic Issues   Hyperlipidemia    Hyperlipidemia This is a chronic problem. He has no history of chronic renal disease, diabetes, hypothyroidism, liver disease or obesity. There are no known factors aggravating his hyperlipidemia. Pertinent negatives include no chest pain, focal sensory loss, focal weakness, leg pain, myalgias or shortness of breath. Current antihyperlipidemic treatment includes herbal therapy (red rice yeast). Compliance problems include adherence to diet and adherence to exercise.  Risk factors for coronary artery disease include male sex and dyslipidemia.   He is eating a regular diet. He does not exercise but is active at work.   He would like to have his testosterone checked today. He reports some decreased stamina. He "doesn't have the get up and go" that he used to.   Patient Active Problem List   Diagnosis Date Noted   Pure hypercholesterolemia 09/23/2021   Erectile dysfunction 09/17/2020   Allergy 09/19/2019   Tobacco abuse 09/19/2019   BMI 25.0-25.9,adult 09/19/2019    Review of Systems  Constitutional:  Negative for chills, diaphoresis, fever and weight loss.  Eyes:  Negative for blurred vision and double vision.  Respiratory:  Negative for shortness of breath.   Cardiovascular:  Negative for chest pain, palpitations, orthopnea, claudication and leg swelling.  Gastrointestinal:  Negative for nausea and vomiting.  Genitourinary:  Negative for dysuria.  Musculoskeletal:  Negative for myalgias.  Neurological:  Negative for dizziness, tingling, tremors, focal weakness, seizures, weakness and headaches.  Endo/Heme/Allergies:  Negative for polydipsia.      Objective:     BP 138/75 Comment: at home per pt  Pulse (!) 59   Temp 97.8 F (36.6 C) (Temporal)    Ht 5' 10" (1.778 m)   Wt 181 lb 4 oz (82.2 kg)   SpO2 98%   BMI 26.01 kg/m    Physical Exam Vitals and nursing note reviewed.  Constitutional:      General: He is not in acute distress.    Appearance: He is not ill-appearing, toxic-appearing or diaphoretic.  HENT:     Mouth/Throat:     Mouth: Mucous membranes are moist.     Pharynx: Oropharynx is clear.  Eyes:     Conjunctiva/sclera: Conjunctivae normal.     Pupils: Pupils are equal, round, and reactive to light.  Neck:     Thyroid: No thyroid mass, thyromegaly or thyroid tenderness.     Vascular: No carotid bruit.  Cardiovascular:     Rate and Rhythm: Normal rate and regular rhythm.     Heart sounds: Normal heart sounds. No murmur heard. Pulmonary:     Effort: Pulmonary effort is normal. No respiratory distress.     Breath sounds: Normal breath sounds. No wheezing, rhonchi or rales.  Chest:     Chest wall: No tenderness.  Abdominal:     General: Bowel sounds are normal. There is no distension.     Palpations: Abdomen is soft.     Tenderness: There is no abdominal tenderness. There is no guarding or rebound.  Musculoskeletal:     Cervical back: No tenderness.     Right lower leg: No edema.     Left lower leg: No edema.  Skin:    General: Skin is warm and dry.  Neurological:     General: No focal   deficit present.     Mental Status: He is alert and oriented to person, place, and time.  Psychiatric:        Mood and Affect: Mood normal.        Behavior: Behavior normal.    No results found for any visits on 03/24/22.    The 10-year ASCVD risk score (Arnett DK, et al., 2019) is: 10.6%    Assessment & Plan:   Lourdes was seen today for medical management of chronic issues and hyperlipidemia.  Diagnoses and all orders for this visit:  Pure hypercholesterolemia Labs pending. Currently on red yeast rice rather than statin per patient preference.  -     Lipid panel -     BMP8+EGFR -     CBC with  Differential/Platelet  Decreased stamina Will check labs as below.  -     Testosterone,Free and Total -     TSH  Return in about 1 year (around 03/25/2023) for CPE.  The patient indicates understanding of these issues and agrees with the plan.    Gwenlyn Perking, FNP

## 2022-04-01 LAB — CBC WITH DIFFERENTIAL/PLATELET
Basophils Absolute: 0.1 10*3/uL (ref 0.0–0.2)
Basos: 2 %
EOS (ABSOLUTE): 0.3 10*3/uL (ref 0.0–0.4)
Eos: 6 %
Hematocrit: 44.8 % (ref 37.5–51.0)
Hemoglobin: 15.1 g/dL (ref 13.0–17.7)
Immature Grans (Abs): 0 10*3/uL (ref 0.0–0.1)
Immature Granulocytes: 0 %
Lymphocytes Absolute: 1.6 10*3/uL (ref 0.7–3.1)
Lymphs: 31 %
MCH: 33.1 pg — ABNORMAL HIGH (ref 26.6–33.0)
MCHC: 33.7 g/dL (ref 31.5–35.7)
MCV: 98 fL — ABNORMAL HIGH (ref 79–97)
Monocytes Absolute: 0.6 10*3/uL (ref 0.1–0.9)
Monocytes: 11 %
Neutrophils Absolute: 2.6 10*3/uL (ref 1.4–7.0)
Neutrophils: 50 %
Platelets: 233 10*3/uL (ref 150–450)
RBC: 4.56 x10E6/uL (ref 4.14–5.80)
RDW: 12.9 % (ref 11.6–15.4)
WBC: 5.3 10*3/uL (ref 3.4–10.8)

## 2022-04-01 LAB — BMP8+EGFR
BUN/Creatinine Ratio: 10 (ref 9–20)
BUN: 9 mg/dL (ref 6–24)
CO2: 23 mmol/L (ref 20–29)
Calcium: 9.7 mg/dL (ref 8.7–10.2)
Chloride: 106 mmol/L (ref 96–106)
Creatinine, Ser: 0.87 mg/dL (ref 0.76–1.27)
Glucose: 92 mg/dL (ref 70–99)
Potassium: 5.1 mmol/L (ref 3.5–5.2)
Sodium: 144 mmol/L (ref 134–144)
eGFR: 99 mL/min/{1.73_m2} (ref >59)

## 2022-04-01 LAB — LIPID PANEL
Chol/HDL Ratio: 2 ratio (ref 0.0–5.0)
Cholesterol, Total: 183 mg/dL (ref 100–199)
HDL: 92 mg/dL (ref >39)
LDL Chol Calc (NIH): 84 mg/dL (ref 0–99)
Triglycerides: 34 mg/dL (ref 0–149)
VLDL Cholesterol Cal: 7 mg/dL (ref 5–40)

## 2022-04-01 LAB — TESTOSTERONE,FREE AND TOTAL
Testosterone, Free: 4.7 pg/mL — ABNORMAL LOW (ref 7.2–24.0)
Testosterone: 430 ng/dL (ref 264–916)

## 2022-04-01 LAB — TSH: TSH: 1.58 u[IU]/mL (ref 0.450–4.500)

## 2023-02-16 ENCOUNTER — Ambulatory Visit: Payer: BC Managed Care – PPO | Admitting: Podiatry

## 2023-03-31 ENCOUNTER — Telehealth: Payer: Self-pay | Admitting: Nurse Practitioner

## 2023-03-31 DIAGNOSIS — U071 COVID-19: Secondary | ICD-10-CM

## 2023-03-31 MED ORDER — FLUTICASONE PROPIONATE 50 MCG/ACT NA SUSP: 2.0000 | Freq: Every day | NASAL | 6 refills | Status: AC

## 2023-03-31 NOTE — Addendum Note (Signed)
Addended by: Bennie Pierini on: 03/31/2023 04:20 PM   Modules accepted: Level of Service

## 2023-03-31 NOTE — Progress Notes (Signed)
E-Visit  for Positive Covid Test Result   We are sorry you are not feeling well. We are here to help!  You have tested positive for COVID-19, meaning that you were infected with the novel coronavirus and could give the virus to others.  Most people with COVID-19 have mild illness and can recover at home without medical care. Do not leave your home, except to get medical care. Do not visit public areas and do not go to places where you are unable to wear a mask. It is important that you stay home  to take care for yourself and to help protect other people in your home and community.      If you want to be treated with antiviral like paxlovid or molnupivir you will have to .schedule a video visit  Isolation Instructions:   You are to isolate at home until you have been fever free for at least 24 hours without a fever-reducing medication, and symptoms have been steadily improving for 24 hours. At that time,  you can end isolation but need to mask for an additional 5 days.  If you must be around other household members who do not have symptoms, you need to make sure that both you and the family members are masking consistently with a high-quality mask.  If you note any worsening of symptoms despite treatment, please seek an in-person evaluation ASAP. If you note any significant shortness of breath or any chest pain, please seek ER evaluation. Please do not delay care!   Go to the nearest hospital ED for assessment if fever/cough/breathlessness are severe or illness seems like a threat to life.    The following symptoms may appear 2-14 days after exposure: Fever Cough Shortness of breath or difficulty breathing Chills Repeated shaking with chills Muscle pain Headache Sore throat New loss of taste or smell Fatigue Congestion or runny nose Nausea or vomiting Diarrhea  You can use medication such as prescription for Fluticasone nasal spray 2 sprays in each nostril one time per day  You  may also take acetaminophen (Tylenol) as needed for fever.  HOME CARE: Only take medications as instructed by your medical team. Drink plenty of fluids and get plenty of rest. A steam or ultrasonic humidifier can help if you have congestion.   GET HELP RIGHT AWAY IF YOU HAVE EMERGENCY WARNING SIGNS.  Call 911 or proceed to your closest emergency facility if: You develop worsening high fever. Trouble breathing Bluish lips or face Persistent pain or pressure in the chest New confusion Inability to wake or stay awake You cough up blood. Your symptoms become more severe Inability to hold down food or fluids  This list is not all possible symptoms. Contact your medical provider for any symptoms that are severe or concerning to you.   Your e-visit answers were reviewed by a board certified advanced clinical practitioner to complete your personal care plan.  Depending on the condition, your plan could have included both over the counter or prescription medications.  If there is a problem please reply once you have received a response from your provider.  Your safety is important to [place].  If you have drug allergies check your prescription carefully.    You can use MyChart to ask questions about today's visit, request a non-urgent call back, or ask for a work or school excuse for 24 hours related to this e-Visit. If it has been greater than 24 hours you will need to follow up with your  provider, or enter a new e-Visit to address those concerns. You will get an e-mail in the next two days asking about your experience.  I hope that your e-visit has been valuable and will speed your recovery. Thank you for using e-visits.  Rodney Daphine Deutscher, FNP   5-10 minutes spent reviewing and documenting in chart.

## 2023-08-31 ENCOUNTER — Encounter: Payer: Self-pay | Admitting: Family Medicine

## 2023-08-31 ENCOUNTER — Ambulatory Visit (INDEPENDENT_AMBULATORY_CARE_PROVIDER_SITE_OTHER): Payer: BC Managed Care – PPO | Admitting: Family Medicine

## 2023-08-31 VITALS — BP 153/86 | HR 54 | Temp 97.5°F | Ht 70.0 in | Wt 197.2 lb

## 2023-08-31 DIAGNOSIS — Z0000 Encounter for general adult medical examination without abnormal findings: Secondary | ICD-10-CM

## 2023-08-31 DIAGNOSIS — Z13228 Encounter for screening for other metabolic disorders: Secondary | ICD-10-CM | POA: Diagnosis not present

## 2023-08-31 DIAGNOSIS — R030 Elevated blood-pressure reading, without diagnosis of hypertension: Secondary | ICD-10-CM

## 2023-08-31 DIAGNOSIS — Z130 Encounter for screening for diseases of the blood and blood-forming organs and certain disorders involving the immune mechanism: Secondary | ICD-10-CM | POA: Diagnosis not present

## 2023-08-31 DIAGNOSIS — F17201 Nicotine dependence, unspecified, in remission: Secondary | ICD-10-CM | POA: Insufficient documentation

## 2023-08-31 DIAGNOSIS — Z1329 Encounter for screening for other suspected endocrine disorder: Secondary | ICD-10-CM

## 2023-08-31 DIAGNOSIS — E7800 Pure hypercholesterolemia, unspecified: Secondary | ICD-10-CM | POA: Diagnosis not present

## 2023-08-31 DIAGNOSIS — Z0001 Encounter for general adult medical examination with abnormal findings: Secondary | ICD-10-CM

## 2023-08-31 DIAGNOSIS — Z125 Encounter for screening for malignant neoplasm of prostate: Secondary | ICD-10-CM | POA: Diagnosis not present

## 2023-08-31 LAB — LIPID PANEL

## 2023-08-31 NOTE — Patient Instructions (Signed)
Health Maintenance, Male  Adopting a healthy lifestyle and getting preventive care are important in promoting health and wellness. Ask your health care provider about:  The right schedule for you to have regular tests and exams.  Things you can do on your own to prevent diseases and keep yourself healthy.  What should I know about diet, weight, and exercise?  Eat a healthy diet    Eat a diet that includes plenty of vegetables, fruits, low-fat dairy products, and lean protein.  Do not eat a lot of foods that are high in solid fats, added sugars, or sodium.  Maintain a healthy weight  Body mass index (BMI) is a measurement that can be used to identify possible weight problems. It estimates body fat based on height and weight. Your health care provider can help determine your BMI and help you achieve or maintain a healthy weight.  Get regular exercise  Get regular exercise. This is one of the most important things you can do for your health. Most adults should:  Exercise for at least 150 minutes each week. The exercise should increase your heart rate and make you sweat (moderate-intensity exercise).  Do strengthening exercises at least twice a week. This is in addition to the moderate-intensity exercise.  Spend less time sitting. Even light physical activity can be beneficial.  Watch cholesterol and blood lipids  Have your blood tested for lipids and cholesterol at 61 years of age, then have this test every 5 years.  You may need to have your cholesterol levels checked more often if:  Your lipid or cholesterol levels are high.  You are older than 61 years of age.  You are at high risk for heart disease.  What should I know about cancer screening?  Many types of cancers can be detected early and may often be prevented. Depending on your health history and family history, you may need to have cancer screening at various ages. This may include screening for:  Colorectal cancer.  Prostate cancer.  Skin cancer.  Lung  cancer.  What should I know about heart disease, diabetes, and high blood pressure?  Blood pressure and heart disease  High blood pressure causes heart disease and increases the risk of stroke. This is more likely to develop in people who have high blood pressure readings or are overweight.  Talk with your health care provider about your target blood pressure readings.  Have your blood pressure checked:  Every 3-5 years if you are 1-31 years of age.  Every year if you are 28 years old or older.  If you are between the ages of 29 and 4 and are a current or former smoker, ask your health care provider if you should have a one-time screening for abdominal aortic aneurysm (AAA).  Diabetes  Have regular diabetes screenings. This checks your fasting blood sugar level. Have the screening done:  Once every three years after age 40 if you are at a normal weight and have a low risk for diabetes.  More often and at a younger age if you are overweight or have a high risk for diabetes.  What should I know about preventing infection?  Hepatitis B  If you have a higher risk for hepatitis B, you should be screened for this virus. Talk with your health care provider to find out if you are at risk for hepatitis B infection.  Hepatitis C  Blood testing is recommended for:  Everyone born from [birth year] through 1963.  Anyone  with known risk factors for hepatitis C.  Sexually transmitted infections (STIs)  You should be screened each year for STIs, including gonorrhea and chlamydia, if:  You are sexually active and are younger than 61 years of age.  You are older than 61 years of age and your health care provider tells you that you are at risk for this type of infection.  Your sexual activity has changed since you were last screened, and you are at increased risk for chlamydia or gonorrhea. Ask your health care provider if you are at risk.  Ask your health care provider about whether you are at high risk for HIV. Your health care provider  may recommend a prescription medicine to help prevent HIV infection. If you choose to take medicine to prevent HIV, you should first get tested for HIV. You should then be tested every 3 months for as long as you are taking the medicine.  Follow these instructions at home:  Alcohol use  Do not drink alcohol if your health care provider tells you not to drink.  If you drink alcohol:  Limit how much you have to 0-2 drinks a day.  Know how much alcohol is in your drink. In the [place] one drink equals one 12 oz bottle of beer (355 mL), one 5 oz glass of wine (148 mL), or one 1 oz glass of hard liquor (44 mL).  Lifestyle  Do not use any products that contain nicotine or tobacco. These products include cigarettes, chewing tobacco, and vaping devices, such as e-cigarettes. If you need help quitting, ask your health care provider.  Do not use street drugs.  Do not share needles.  Ask your health care provider for help if you need support or information about quitting drugs.  General instructions  Schedule regular health, dental, and eye exams.  Stay current with your vaccines.  Tell your health care provider if:  You often feel depressed.  You have ever been abused or do not feel safe at home.  Summary  Adopting a healthy lifestyle and getting preventive care are important in promoting health and wellness.  Follow your health care provider's instructions about healthy diet, exercising, and getting tested or screened for diseases.  Follow your health care provider's instructions on monitoring your cholesterol and blood pressure.  This information is not intended to replace advice given to you by your health care provider. Make sure you discuss any questions you have with your health care provider.  Document Revised: 12/13/2020 Document Reviewed: 12/13/2020  Elsevier Patient Education  2024 [employer].

## 2023-08-31 NOTE — Progress Notes (Signed)
Complete physical exam  Patient: Rodney Sims   DOB: 10/07/1960   63 y.o. Male  MRN: [id number]  Subjective:    Chief Complaint  Patient presents with   Annual Exam    DARY DILAURO is a 63 y.o. male who presents today for a complete physical exam. He reports consuming a general diet. The patient has a physically strenuous job, but has no regular exercise apart from work.  He generally feels well. He reports sleeping well. He does not have additional problems to discuss today.   Quit smoking 8 months ago.   Didn't start statin for HLD. He is taking red yeast rice.   Most recent fall risk assessment:    08/31/2023    8:23 AM  Fall Risk   Falls in the past year? 0     Most recent depression screenings:    08/31/2023    8:23 AM 03/24/2022    8:05 AM  PHQ 2/9 Scores  PHQ - 2 Score 0 0  PHQ- 9 Score 0 0    Vision:Within last year and Dental: No current dental problems and Receives regular dental care  Past Medical History:  Diagnosis Date   Allergy    Hyperlipidemia       Patient Care Team: Gabriel Earing, FNP as PCP - General (Family Medicine)   Outpatient Medications Prior to Visit  Medication Sig   Fexofenadine HCl (ALLEGRA PO) Take by mouth.   fluticasone (FLONASE) 50 MCG/ACT nasal spray Place 2 sprays into both nostrils daily.   Red Yeast Rice 600 MG CAPS Take 1,200 mg by mouth daily.   [DISCONTINUED] rosuvastatin (CRESTOR) 10 MG tablet Take 1 tablet (10 mg total) by mouth daily.   No facility-administered medications prior to visit.    ROS Negative unless specially indicated above in HPI.        Objective:     BP (!) 153/86   Pulse (!) 54   Temp (!) 97.5 F (36.4 C) (Temporal)   Ht 5\' 10"  (1.778 m)   Wt 197 lb 3.2 oz (89.4 kg)   SpO2 98%   BMI 28.30 kg/m  BP Readings from Last 3 Encounters:  08/31/23 (!) 153/86  03/24/22 138/75  01/11/22 138/82      Physical Exam Vitals and nursing note reviewed.  Constitutional:       General: He is not in acute distress.    Appearance: Normal appearance. He is not ill-appearing, toxic-appearing or diaphoretic.  HENT:     Head: Normocephalic.     Right Ear: Tympanic membrane, ear canal and external ear normal.     Left Ear: Tympanic membrane, ear canal and external ear normal.     Nose: Nose normal.     Mouth/Throat:     Mouth: Mucous membranes are moist.     Pharynx: Oropharynx is clear.  Eyes:     Extraocular Movements: Extraocular movements intact.     Conjunctiva/sclera: Conjunctivae normal.     Pupils: Pupils are equal, round, and reactive to light.  Cardiovascular:     Rate and Rhythm: Normal rate and regular rhythm.     Pulses: Normal pulses.     Heart sounds: Normal heart sounds. No murmur heard.    No friction rub. No gallop.  Pulmonary:     Effort: Pulmonary effort is normal.     Breath sounds: Normal breath sounds.  Abdominal:     General: Bowel sounds are normal. There is no distension.  Palpations: Abdomen is soft. There is no mass.     Tenderness: There is no abdominal tenderness. There is no guarding.  Musculoskeletal:     Cervical back: Normal range of motion and neck supple. No tenderness.     Right lower leg: No edema.     Left lower leg: No edema.  Skin:    General: Skin is warm and dry.     Capillary Refill: Capillary refill takes less than 2 seconds.     Findings: No lesion or rash.  Neurological:     General: No focal deficit present.     Mental Status: He is alert and oriented to person, place, and time.     Cranial Nerves: No cranial nerve deficit.     Motor: No weakness.     Coordination: Coordination normal.     Gait: Gait normal.  Psychiatric:        Mood and Affect: Mood normal.        Behavior: Behavior normal.        Thought Content: Thought content normal.      No results found for any visits on 08/31/23.     Assessment & Plan:    Routine Health Maintenance and Physical Exam  Lenin "[name]" was seen today for  annual exam.  Diagnoses and all orders for this visit:  Routine general medical examination at a health care facility  Screening for endocrine, metabolic and immunity disorder -     CBC with Differential/Platelet -     CMP14+EGFR -     Cancel: TSH Rfx on Abnormal to Free T4 -     TSH  Screening for prostate cancer -     PSA, total and free  Elevated blood pressure reading in office without diagnosis of hypertension BP elevated today. Asymptomatic. He will monitor BP at home and notify for elevated home readings.   Pure hypercholesterolemia Fasting panel pending.  -     Lipid panel  Tobacco abuse, in remission Quit 8 months ago.    Immunization History  Administered Date(s) Administered   Tdap 02/06/2011    Health Maintenance  Topic Date Due   INFLUENZA VACCINE  11/05/2023 (Originally 03/08/2023)   Lung Cancer Screening  08/30/2024 (Originally 08/23/2012)   DTaP/Tdap/Td (2 - Td or Tdap) 08/30/2024 (Originally 02/05/2021)   Pneumococcal Vaccine 38-35 Years old (1 of 2 - PCV) 08/30/2024 (Originally 08/23/1966)   COVID-19 Vaccine (1 - 2024-25 season) 09/15/2024 (Originally 04/08/2023)   Zoster Vaccines- Shingrix (1 of 2) 11/28/2024 (Originally 08/23/2012)   Fecal DNA (Cologuard)  10/03/2023   Hepatitis C Screening  Completed   HIV Screening  Completed   HPV VACCINES  Aged Out    Discussed health benefits of physical activity, and encouraged him to engage in regular exercise appropriate for his age and condition.  Problem List Items Addressed This Visit       Other   Pure hypercholesterolemia   Relevant Orders   Lipid panel   Tobacco abuse, in remission   Other Visit Diagnoses       Routine general medical examination at a health care facility    -  Primary     Screening for endocrine, metabolic and immunity disorder       Relevant Orders   CBC with Differential/Platelet   CMP14+EGFR   TSH     Screening for prostate cancer       Relevant Orders   PSA, total and  free     Elevated  blood pressure reading in office without diagnosis of hypertension          Return in 1 year (on 08/30/2024).   The patient indicates understanding of these issues and agrees with the plan.  Gabriel Earing, FNP

## 2023-09-01 LAB — CMP14+EGFR
ALT: 23 IU/L (ref 0–44)
AST: 25 IU/L (ref 0–40)
Albumin: 4.6 g/dL (ref 3.9–4.9)
Alkaline Phosphatase: 70 IU/L (ref 44–121)
BUN/Creatinine Ratio: 11 (ref 10–24)
BUN: 10 mg/dL (ref 8–27)
Bilirubin Total: 0.6 mg/dL (ref 0.0–1.2)
CO2: 22 mmol/L (ref 20–29)
Calcium: 9.9 mg/dL (ref 8.6–10.2)
Chloride: 104 mmol/L (ref 96–106)
Creatinine, Ser: 0.87 mg/dL (ref 0.76–1.27)
Globulin, Total: 2.6 g/dL (ref 1.5–4.5)
Glucose: 97 mg/dL (ref 70–99)
Potassium: 5 mmol/L (ref 3.5–5.2)
Sodium: 141 mmol/L (ref 134–144)
Total Protein: 7.2 g/dL (ref 6.0–8.5)
eGFR: 98 mL/min/{1.73_m2} (ref >59)

## 2023-09-01 LAB — LIPID PANEL
Cholesterol, Total: 187 mg/dL (ref 100–199)
HDL: 75 mg/dL (ref >39)
LDL CALC COMMENT:: 2.5 ratio (ref 0.0–5.0)
LDL Chol Calc (NIH): 100 mg/dL — ABNORMAL HIGH (ref 0–99)
Triglycerides: 65 mg/dL (ref 0–149)
VLDL Cholesterol Cal: 12 mg/dL (ref 5–40)

## 2023-09-01 LAB — CBC WITH DIFFERENTIAL/PLATELET
Basophils Absolute: 0.1 10*3/uL (ref 0.0–0.2)
Basos: 2 %
EOS (ABSOLUTE): 0.3 10*3/uL (ref 0.0–0.4)
Eos: 6 %
Hematocrit: 43.7 % (ref 37.5–51.0)
Hemoglobin: 14.3 g/dL (ref 13.0–17.7)
Immature Grans (Abs): 0 10*3/uL (ref 0.0–0.1)
Immature Granulocytes: 0 %
Lymphocytes Absolute: 1.8 10*3/uL (ref 0.7–3.1)
Lymphs: 38 %
MCH: 31.7 pg (ref 26.6–33.0)
MCHC: 32.7 g/dL (ref 31.5–35.7)
MCV: 97 fL (ref 79–97)
Monocytes Absolute: 0.6 10*3/uL (ref 0.1–0.9)
Monocytes: 12 %
Neutrophils Absolute: 2 10*3/uL (ref 1.4–7.0)
Neutrophils: 42 %
Platelets: 251 10*3/uL (ref 150–450)
RBC: 4.51 x10E6/uL (ref 4.14–5.80)
RDW: 12.8 % (ref 11.6–15.4)
WBC: 4.7 10*3/uL (ref 3.4–10.8)

## 2023-09-01 LAB — PSA, TOTAL AND FREE
PSA, Free Pct: 23.8 %
PSA, Free: 0.31 ng/mL
Prostate Specific Ag, Serum: 1.3 ng/mL (ref 0.0–4.0)

## 2023-09-01 LAB — TSH: TSH: 2.45 u[IU]/mL (ref 0.450–4.500)

## 2023-09-05 ENCOUNTER — Other Ambulatory Visit: Payer: Self-pay | Admitting: Family Medicine

## 2023-09-05 DIAGNOSIS — E7800 Pure hypercholesterolemia, unspecified: Secondary | ICD-10-CM

## 2023-09-05 MED ORDER — ROSUVASTATIN CALCIUM 20 MG PO TABS: 20.0000 mg | ORAL_TABLET | Freq: Every day | ORAL | 3 refills | Status: AC

## 2024-09-05 ENCOUNTER — Encounter: Payer: BC Managed Care – PPO | Admitting: Family Medicine
